# Patient Record
Sex: Female | Born: 1970 | Race: White | Hispanic: No | Marital: Married | Smoking: Never smoker
Health system: Southern US, Community
[De-identification: ages and names within clinical notes are randomized; demographics above are authoritative.]

## PROBLEM LIST (undated history)

## (undated) DIAGNOSIS — C801 Malignant (primary) neoplasm, unspecified: Secondary | ICD-10-CM

## (undated) DIAGNOSIS — M1990 Unspecified osteoarthritis, unspecified site: Secondary | ICD-10-CM

## (undated) DIAGNOSIS — M359 Systemic involvement of connective tissue, unspecified: Secondary | ICD-10-CM

## (undated) DIAGNOSIS — J189 Pneumonia, unspecified organism: Secondary | ICD-10-CM

## (undated) DIAGNOSIS — E559 Vitamin D deficiency, unspecified: Secondary | ICD-10-CM

## (undated) DIAGNOSIS — K219 Gastro-esophageal reflux disease without esophagitis: Secondary | ICD-10-CM

## (undated) DIAGNOSIS — C55 Malignant neoplasm of uterus, part unspecified: Secondary | ICD-10-CM

## (undated) DIAGNOSIS — D649 Anemia, unspecified: Secondary | ICD-10-CM

## (undated) DIAGNOSIS — E039 Hypothyroidism, unspecified: Secondary | ICD-10-CM

## (undated) DIAGNOSIS — E079 Disorder of thyroid, unspecified: Secondary | ICD-10-CM

## (undated) HISTORY — PX: ABDOMINAL HYSTERECTOMY: SHX81

## (undated) HISTORY — PX: NASAL SEPTUM SURGERY: SHX37

## (undated) HISTORY — PX: TONSILLECTOMY: SUR1361

## (undated) HISTORY — PX: OOPHORECTOMY: SHX86

---

## 2007-02-18 ENCOUNTER — Ambulatory Visit: Payer: Self-pay

## 2007-03-19 ENCOUNTER — Ambulatory Visit: Payer: Self-pay | Admitting: Gastroenterology

## 2010-11-09 ENCOUNTER — Ambulatory Visit: Payer: Self-pay | Admitting: Family Medicine

## 2013-11-06 ENCOUNTER — Ambulatory Visit: Payer: Self-pay | Admitting: Nurse Practitioner

## 2013-11-13 ENCOUNTER — Ambulatory Visit: Payer: Self-pay | Admitting: Nurse Practitioner

## 2015-11-18 ENCOUNTER — Other Ambulatory Visit: Payer: Self-pay | Admitting: Nurse Practitioner

## 2015-11-18 DIAGNOSIS — Z1231 Encounter for screening mammogram for malignant neoplasm of breast: Secondary | ICD-10-CM

## 2017-07-31 ENCOUNTER — Other Ambulatory Visit: Payer: Self-pay

## 2017-07-31 ENCOUNTER — Encounter: Payer: Self-pay | Admitting: Emergency Medicine

## 2017-07-31 ENCOUNTER — Ambulatory Visit
Admission: EM | Admit: 2017-07-31 | Discharge: 2017-07-31 | Disposition: A | Discharge status: Home / Self Care | Payer: BLUE CROSS/BLUE SHIELD | Attending: Family Medicine | Admitting: Family Medicine

## 2017-07-31 DIAGNOSIS — M2550 Pain in unspecified joint: Secondary | ICD-10-CM

## 2017-07-31 DIAGNOSIS — M25542 Pain in joints of left hand: Secondary | ICD-10-CM

## 2017-07-31 DIAGNOSIS — M25541 Pain in joints of right hand: Secondary | ICD-10-CM | POA: Diagnosis not present

## 2017-07-31 HISTORY — DX: Disorder of thyroid, unspecified: E07.9

## 2017-07-31 HISTORY — DX: Vitamin D deficiency, unspecified: E55.9

## 2017-07-31 MED ORDER — TRAMADOL HCL 50 MG PO TABS: 50.0000 mg | ORAL_TABLET | Freq: Three times a day (TID) | ORAL | 0 refills | Status: AC | PRN

## 2017-07-31 MED ORDER — MELOXICAM 15 MG PO TABS: 15.0000 mg | ORAL_TABLET | Freq: Every day | ORAL | 0 refills | Status: DC | PRN

## 2017-07-31 NOTE — ED Provider Notes (Signed)
[facility]   CSN: [id number] Arrival date & time: 07/31/17  0813  History   Chief Complaint Chief Complaint  Patient presents with  . Joint Pain  . Joint Swelling   HPI   47 year old female presents with joint pain and swelling.  Patient states that this is been going on since November or December.  Has been worse over the past 2 weeks.  She states that she has moderate to severe joint pain and swelling, particularly of the hands.  She states that it also affects the wrists, shoulders, knees, ankles, toes.  The hand pain and swelling is her predominant complaint.  No reports of joint redness or warmth.  Worse in the morning.  She is used ibuprofen and Aleve with some improvement initially but no improvement as of late.  No other associated symptoms.  No other complaints.  Past Medical History:  Diagnosis Date  . Thyroid disease   . Vitamin D deficiency    Past Surgical History:  Procedure Laterality Date  . NASAL SEPTUM SURGERY    . TONSILLECTOMY     OB History   None    Home Medications    Prior to Admission medications   Medication Sig Start Date End Date Taking? Authorizing Provider  Cholecalciferol (VITAMIN D) 2000 units CAPS Take 0.5 capsules by mouth 2 (two) times daily.   Yes [provider]  thyroid (ARMOUR THYROID) 90 MG tablet Take 0.5 tablets by mouth 2 (two) times daily. 11/30/16  Yes [provider]  meloxicam (MOBIC) 15 MG tablet Take 1 tablet (15 mg total) by mouth daily as needed. 07/31/17   Coral Spikes, DO  traMADol (ULTRAM) 50 MG tablet Take 1 tablet (50 mg total) by mouth every 8 (eight) hours as needed for up to 5 days. 07/31/17 08/05/17  Coral Spikes, DO    Family History Family History  Problem Relation Age of Onset  . Non-Hodgkin's lymphoma Mother   . Heart disease Father   . CAD Father 63       CABG x 3 vessels    Social History Social History   Tobacco Use  . Smoking status: Never Smoker  . Smokeless  tobacco: Never Used  Substance Use Topics  . Alcohol use: Yes    Alcohol/week: 1.2 oz    Types: 2 Glasses of wine per week  . Drug use: Never     Allergies   Patient has no known allergies.   Review of Systems Review of Systems  Constitutional: Negative.   Musculoskeletal: Positive for arthralgias and joint swelling.   Physical Exam Triage Vital Signs ED Triage Vitals  Enc Vitals Group     BP 07/31/17 0843 116/78     Pulse Rate 07/31/17 0843 80     Resp 07/31/17 0843 16     Temp 07/31/17 0843 98 F (36.7 C)     Temp Source 07/31/17 0843 Oral     SpO2 07/31/17 0843 100 %     Weight 07/31/17 0843 160 lb (72.6 kg)     Height 07/31/17 0843 5\' 6"  (1.676 m)     Head Circumference --      Peak Flow --      Pain Score 07/31/17 0842 5     Pain Loc --      Pain Edu? --      Excl. in [place]? --   Updated Vital Signs BP 116/78 (BP Location: Left Arm)   Pulse 80   Temp  59 F (36.7 C) (Oral)   Resp 16   Ht 5\' 6"  (1.676 m)   Wt 160 lb (72.6 kg)   LMP 07/19/2017 (Exact Date)   SpO2 100%   BMI 25.82 kg/m   Physical Exam  Constitutional: She is oriented to person, place, and time. She appears well-developed. No distress.  Cardiovascular: Normal rate and regular rhythm.  Pulmonary/Chest: Effort normal and breath sounds normal. She has no wheezes. She has no rales.  Musculoskeletal:  Hands -swelling noted of the PIP joints predominantly.  Nontender to palpation but tender with range of motion.  Normal range of motion of the wrist.  No discrete areas of tenderness.  Neurological: She is alert and oriented to person, place, and time.  Psychiatric: She has a normal mood and affect. Her behavior is normal.  Nursing note and vitals reviewed.    UC Treatments / Results  Labs (all labs ordered are listed, but only abnormal results are displayed) Labs Reviewed - No data to display  EKG None Radiology No results found.  Procedures Procedures (including critical care  time)  Medications Ordered in UC Medications - No data to display   Initial Impression / Assessment and Plan / UC Course  I have reviewed the triage vital signs and the nursing notes.  Pertinent labs & imaging results that were available during my care of the patient were reviewed by me and considered in my medical decision making (see chart for details).    47 year old female presents with joint pain and swelling, particularly of the fingers/hands.  Possible underlying rheumatologic disease.  Advised to follow-up with her primary and discuss rheumatology referral.  Treating with meloxicam and tramadol.  Final Clinical Impressions(s) / UC Diagnoses   Final diagnoses:  Pain in joint, multiple sites    ED Discharge Orders        Ordered    meloxicam (MOBIC) 15 MG tablet  Daily PRN     07/31/17 0914    traMADol (ULTRAM) 50 MG tablet  Every 8 hours PRN     07/31/17 0914     Controlled Substance Prescriptions [place] Controlled Substance Registry consulted? Yes. No patient was found in the database.   Coral Spikes, DO 07/31/17 1013

## 2017-07-31 NOTE — Discharge Instructions (Signed)
Medications as prescribed.  Call your primary for an appt.  Take care  Dr. Lacinda Axon

## 2017-07-31 NOTE — ED Triage Notes (Signed)
Patient in today c/o bilateral multiple joint pain and swelling x 4 months worsening over the last week.

## 2017-12-26 ENCOUNTER — Other Ambulatory Visit: Payer: Self-pay | Admitting: Nurse Practitioner

## 2017-12-26 DIAGNOSIS — Z1231 Encounter for screening mammogram for malignant neoplasm of breast: Secondary | ICD-10-CM

## 2018-03-28 ENCOUNTER — Encounter
Admission: RE | Admit: 2018-03-28 | Discharge: 2018-03-28 | Disposition: A | Discharge status: Home / Self Care | Payer: BLUE CROSS/BLUE SHIELD | Source: Ambulatory Visit | Attending: Obstetrics & Gynecology | Admitting: Obstetrics & Gynecology

## 2018-03-28 ENCOUNTER — Other Ambulatory Visit: Payer: Self-pay

## 2018-03-28 DIAGNOSIS — Z01812 Encounter for preprocedural laboratory examination: Secondary | ICD-10-CM | POA: Diagnosis present

## 2018-03-28 HISTORY — DX: Pneumonia, unspecified organism: J18.9

## 2018-03-28 HISTORY — DX: Unspecified osteoarthritis, unspecified site: M19.90

## 2018-03-28 HISTORY — DX: Anemia, unspecified: D64.9

## 2018-03-28 HISTORY — DX: Hypothyroidism, unspecified: E03.9

## 2018-03-28 LAB — TYPE AND SCREEN
ABO/RH(D): A POS
ANTIBODY SCREEN: NEGATIVE

## 2018-03-28 LAB — BASIC METABOLIC PANEL
Anion gap: 6 (ref 5–15)
BUN: 11 mg/dL (ref 6–20)
CHLORIDE: 109 mmol/L (ref 98–111)
CO2: 24 mmol/L (ref 22–32)
Calcium: 8.8 mg/dL — ABNORMAL LOW (ref 8.9–10.3)
Creatinine, Ser: 0.63 mg/dL (ref 0.44–1.00)
GFR calc Af Amer: >60 mL/min (ref >60)
GFR calc non Af Amer: >60 mL/min (ref >60)
Glucose, Bld: 86 mg/dL (ref 70–99)
Potassium: 3.9 mmol/L (ref 3.5–5.1)
Sodium: 139 mmol/L (ref 135–145)

## 2018-03-28 LAB — CBC
HCT: 33.3 % — ABNORMAL LOW (ref 36.0–46.0)
HEMOGLOBIN: 10.4 g/dL — AB (ref 12.0–15.0)
MCH: 29.1 pg (ref 26.0–34.0)
MCHC: 31.2 g/dL (ref 30.0–36.0)
MCV: 93 fL (ref 80.0–100.0)
Platelets: 354 10*3/uL (ref 150–400)
RBC: 3.58 MIL/uL — ABNORMAL LOW (ref 3.87–5.11)
RDW: 15 % (ref 11.5–15.5)
WBC: 5.1 10*3/uL (ref 4.0–10.5)
nRBC: 0 % (ref 0.0–0.2)

## 2018-03-28 NOTE — Patient Instructions (Signed)
Your procedure is scheduled on: April 05, 2018 Friday  Report to Day Surgery on the 2nd floor of the [facility]. To find out your arrival time, please call [phone] between 1PM - 3PM on: April 04, 2018 THURSDAY  REMEMBER: Instructions that are not followed completely may result in serious medical risk, up to and including death; or upon the discretion of your surgeon and anesthesiologist your surgery may need to be rescheduled.  Do not eat food after midnight the night before surgery.  No gum chewing, lozengers or hard candies.  You may however, drink CLEAR liquids up to 2 hours before you are scheduled to arrive for your surgery. Do not drink anything within 2 hours of the start of your surgery.  Clear liquids include: - water  - apple juice without pulp -CLEAR  gatorade - black coffee or tea (Do NOT add milk or creamers to the coffee or tea) Do NOT drink anything that is not on this list.  Type 1 and Type 2 diabetics should only drink water.  No Alcohol for 24 hours before or after surgery.  No Smoking including e-cigarettes for 24 hours prior to surgery.  No chewable tobacco products for at least 6 hours prior to surgery.  No nicotine patches on the day of surgery.  On the morning of surgery brush your teeth with toothpaste and water, you may rinse your mouth with mouthwash if you wish. Do not swallow any toothpaste or mouthwash.  Notify your doctor if there is any change in your medical condition (cold, fever, infection).  Do not wear jewelry, make-up, hairpins, clips or nail polish.  Do not wear lotions, powders, or perfumes.   Do not shave 48 hours prior to surgery.   Contacts and dentures may not be worn into surgery.  Do not bring valuables to the hospital, including drivers license, insurance or credit cards.  [facility] is not responsible for any belongings or valuables.   TAKE THESE MEDICATIONS THE MORNING OF SURGERY: ARMOUR THYROID  Use CHG  Soap  as directed on instruction sheet.  Stop Anti-inflammatories (NSAIDS) such as Advil, Aleve, Ibuprofen, Motrin, Naproxen, Naprosyn and Aspirin based products such as Excedrin, Goodys Powder, BC Powder. (May take Tylenol or Acetaminophen if needed.)  Stop ANY OVER THE COUNTER supplements until after surgery. FOLIC ACID  (May continue Vitamin D, Vitamin B, and multivitamin, FERROUS SULFATE .)  Wear comfortable clothing (specific to your surgery type) to the hospital.  Plan for stool softeners for home use.  If you are being discharged the day of surgery, you will not be allowed to drive home. You will need a responsible adult to drive you home and stay with you that night.   If you are taking public transportation, you will need to have a responsible adult with you. Please confirm with your physician that it is acceptable to use public transportation.   Please call [phone] if you have any questions about these instructions.

## 2018-04-04 MED ORDER — CEFAZOLIN SODIUM-DEXTROSE 2-4 GM/100ML-% IV SOLN
2.0000 g | Freq: Once | INTRAVENOUS | Status: AC
  Administered 2018-04-05: 2 g via INTRAVENOUS

## 2018-04-05 ENCOUNTER — Other Ambulatory Visit: Payer: Self-pay

## 2018-04-05 ENCOUNTER — Ambulatory Visit
Admission: RE | Admit: 2018-04-05 | Discharge: 2018-04-05 | Disposition: A | Discharge status: Home / Self Care | Payer: BLUE CROSS/BLUE SHIELD | Attending: Obstetrics & Gynecology | Admitting: Obstetrics & Gynecology

## 2018-04-05 ENCOUNTER — Encounter
Admission: RE | Disposition: A | Discharge status: Home / Self Care | Payer: Self-pay | Source: Home / Self Care | Attending: Obstetrics & Gynecology

## 2018-04-05 ENCOUNTER — Ambulatory Visit: Payer: BLUE CROSS/BLUE SHIELD | Admitting: Certified Registered Nurse Anesthetist

## 2018-04-05 ENCOUNTER — Encounter: Payer: Self-pay | Admitting: *Deleted

## 2018-04-05 DIAGNOSIS — M1990 Unspecified osteoarthritis, unspecified site: Secondary | ICD-10-CM | POA: Diagnosis not present

## 2018-04-05 DIAGNOSIS — C541 Malignant neoplasm of endometrium: Secondary | ICD-10-CM | POA: Insufficient documentation

## 2018-04-05 DIAGNOSIS — E039 Hypothyroidism, unspecified: Secondary | ICD-10-CM | POA: Diagnosis not present

## 2018-04-05 DIAGNOSIS — D649 Anemia, unspecified: Secondary | ICD-10-CM | POA: Insufficient documentation

## 2018-04-05 DIAGNOSIS — E559 Vitamin D deficiency, unspecified: Secondary | ICD-10-CM | POA: Insufficient documentation

## 2018-04-05 DIAGNOSIS — N920 Excessive and frequent menstruation with regular cycle: Secondary | ICD-10-CM | POA: Insufficient documentation

## 2018-04-05 DIAGNOSIS — Z79899 Other long term (current) drug therapy: Secondary | ICD-10-CM | POA: Diagnosis not present

## 2018-04-05 DIAGNOSIS — D259 Leiomyoma of uterus, unspecified: Secondary | ICD-10-CM

## 2018-04-05 DIAGNOSIS — Z807 Family history of other malignant neoplasms of lymphoid, hematopoietic and related tissues: Secondary | ICD-10-CM | POA: Diagnosis not present

## 2018-04-05 DIAGNOSIS — Z8249 Family history of ischemic heart disease and other diseases of the circulatory system: Secondary | ICD-10-CM | POA: Diagnosis not present

## 2018-04-05 HISTORY — PX: LAPAROSCOPIC BILATERAL SALPINGECTOMY: SHX5889

## 2018-04-05 HISTORY — PX: LAPAROSCOPIC SUPRACERVICAL HYSTERECTOMY: SHX5399

## 2018-04-05 LAB — POCT PREGNANCY, URINE: Preg Test, Ur: NEGATIVE

## 2018-04-05 LAB — ABO/RH: ABO/RH(D): A POS

## 2018-04-05 SURGERY — HYSTERECTOMY, SUPRACERVICAL, LAPAROSCOPIC
Anesthesia: General

## 2018-04-05 MED ORDER — CELECOXIB 200 MG PO CAPS
400.0000 mg | ORAL_CAPSULE | Freq: Once | ORAL | Status: AC
  Administered 2018-04-05: 400 mg via ORAL

## 2018-04-05 MED ORDER — GABAPENTIN 300 MG PO CAPS
600.0000 mg | ORAL_CAPSULE | Freq: Once | ORAL | Status: AC
  Administered 2018-04-05: 600 mg via ORAL

## 2018-04-05 MED ORDER — FENTANYL CITRATE (PF) 100 MCG/2ML IJ SOLN
INTRAMUSCULAR | Status: AC
  Administered 2018-04-05: 25 ug via INTRAVENOUS
  Filled 2018-04-05: qty 2

## 2018-04-05 MED ORDER — GABAPENTIN 300 MG PO CAPS
ORAL_CAPSULE | ORAL | Status: AC
  Filled 2018-04-05: qty 2

## 2018-04-05 MED ORDER — ACETAMINOPHEN 500 MG PO TABS
1000.0000 mg | ORAL_TABLET | Freq: Once | ORAL | Status: AC
  Administered 2018-04-05: 1000 mg via ORAL

## 2018-04-05 MED ORDER — HEPARIN SODIUM (PORCINE) 5000 UNIT/ML IJ SOLN
INTRAMUSCULAR | Status: AC
  Filled 2018-04-05: qty 1

## 2018-04-05 MED ORDER — LIDOCAINE HCL (CARDIAC) PF 100 MG/5ML IV SOSY
PREFILLED_SYRINGE | INTRAVENOUS | Status: DC | PRN
  Administered 2018-04-05: 100 mg via INTRAVENOUS

## 2018-04-05 MED ORDER — MORPHINE SULFATE (PF) 4 MG/ML IV SOLN: 1.0000 mg | INTRAVENOUS | Status: DC | PRN

## 2018-04-05 MED ORDER — ROCURONIUM BROMIDE 50 MG/5ML IV SOLN
INTRAVENOUS | Status: AC
  Filled 2018-04-05: qty 1

## 2018-04-05 MED ORDER — HEPARIN SODIUM (PORCINE) 5000 UNIT/ML IJ SOLN
5000.0000 [IU] | Freq: Once | INTRAMUSCULAR | Status: AC
  Administered 2018-04-05: 5000 [IU] via SUBCUTANEOUS

## 2018-04-05 MED ORDER — PROPOFOL 10 MG/ML IV BOLUS
INTRAVENOUS | Status: AC
  Filled 2018-04-05: qty 60

## 2018-04-05 MED ORDER — BUPIVACAINE LIPOSOME 1.3 % IJ SUSP
INTRAMUSCULAR | Status: AC
  Filled 2018-04-05: qty 20

## 2018-04-05 MED ORDER — LIDOCAINE HCL (PF) 2 % IJ SOLN
INTRAMUSCULAR | Status: AC
  Filled 2018-04-05: qty 10

## 2018-04-05 MED ORDER — FENTANYL CITRATE (PF) 100 MCG/2ML IJ SOLN
25.0000 ug | INTRAMUSCULAR | Status: DC | PRN
  Administered 2018-04-05 (×2): 25 ug via INTRAVENOUS

## 2018-04-05 MED ORDER — OXYCODONE HCL 5 MG PO TABS
ORAL_TABLET | ORAL | Status: AC
  Administered 2018-04-05: 5 mg via ORAL
  Filled 2018-04-05: qty 1

## 2018-04-05 MED ORDER — LACTATED RINGERS IV SOLN: INTRAVENOUS | Status: DC

## 2018-04-05 MED ORDER — OXYCODONE HCL 5 MG PO TABS
5.0000 mg | ORAL_TABLET | Freq: Once | ORAL | Status: AC
  Administered 2018-04-05: 5 mg via ORAL

## 2018-04-05 MED ORDER — ONDANSETRON HCL 4 MG/2ML IJ SOLN
INTRAMUSCULAR | Status: DC | PRN
  Administered 2018-04-05: 4 mg via INTRAVENOUS

## 2018-04-05 MED ORDER — MIDAZOLAM HCL 2 MG/2ML IJ SOLN
INTRAMUSCULAR | Status: DC | PRN
  Administered 2018-04-05: 2 mg via INTRAVENOUS

## 2018-04-05 MED ORDER — LACTATED RINGERS IV SOLN
INTRAVENOUS | Status: DC
  Administered 2018-04-05 (×2): via INTRAVENOUS

## 2018-04-05 MED ORDER — BUPIVACAINE HCL (PF) 0.5 % IJ SOLN
INTRAMUSCULAR | Status: AC
  Filled 2018-04-05: qty 30

## 2018-04-05 MED ORDER — BUPIVACAINE LIPOSOME 1.3 % IJ SUSP
INTRAMUSCULAR | Status: DC | PRN
  Administered 2018-04-05: 20 mL

## 2018-04-05 MED ORDER — CELECOXIB 200 MG PO CAPS
ORAL_CAPSULE | ORAL | Status: AC
  Filled 2018-04-05: qty 2

## 2018-04-05 MED ORDER — SUGAMMADEX SODIUM 200 MG/2ML IV SOLN
INTRAVENOUS | Status: DC | PRN
  Administered 2018-04-05: 180 mg via INTRAVENOUS

## 2018-04-05 MED ORDER — FAMOTIDINE 20 MG PO TABS
20.0000 mg | ORAL_TABLET | Freq: Once | ORAL | Status: AC
  Administered 2018-04-05: 20 mg via ORAL

## 2018-04-05 MED ORDER — DEXAMETHASONE SODIUM PHOSPHATE 10 MG/ML IJ SOLN
INTRAMUSCULAR | Status: AC
  Filled 2018-04-05: qty 1

## 2018-04-05 MED ORDER — ROCURONIUM BROMIDE 100 MG/10ML IV SOLN
INTRAVENOUS | Status: DC | PRN
  Administered 2018-04-05: 50 mg via INTRAVENOUS
  Administered 2018-04-05: 20 mg via INTRAVENOUS
  Administered 2018-04-05: 10 mg via INTRAVENOUS

## 2018-04-05 MED ORDER — FENTANYL CITRATE (PF) 100 MCG/2ML IJ SOLN
INTRAMUSCULAR | Status: AC
  Filled 2018-04-05: qty 2

## 2018-04-05 MED ORDER — ACETAMINOPHEN 500 MG PO TABS
ORAL_TABLET | ORAL | Status: AC
  Filled 2018-04-05: qty 2

## 2018-04-05 MED ORDER — FENTANYL CITRATE (PF) 100 MCG/2ML IJ SOLN
INTRAMUSCULAR | Status: DC | PRN
  Administered 2018-04-05 (×2): 50 ug via INTRAVENOUS

## 2018-04-05 MED ORDER — DEXAMETHASONE SODIUM PHOSPHATE 10 MG/ML IJ SOLN
INTRAMUSCULAR | Status: DC | PRN
  Administered 2018-04-05: 8 mg via INTRAVENOUS

## 2018-04-05 MED ORDER — KETOROLAC TROMETHAMINE 30 MG/ML IJ SOLN
INTRAMUSCULAR | Status: DC | PRN
  Administered 2018-04-05: 30 mg via INTRAVENOUS

## 2018-04-05 MED ORDER — PROPOFOL 10 MG/ML IV BOLUS
INTRAVENOUS | Status: DC | PRN
  Administered 2018-04-05: 150 mg via INTRAVENOUS

## 2018-04-05 MED ORDER — FAMOTIDINE 20 MG PO TABS
ORAL_TABLET | ORAL | Status: AC
  Filled 2018-04-05: qty 1

## 2018-04-05 MED ORDER — IBUPROFEN 800 MG PO TABS: 800.0000 mg | ORAL_TABLET | Freq: Four times a day (QID) | ORAL | 0 refills | Status: DC

## 2018-04-05 MED ORDER — SUGAMMADEX SODIUM 200 MG/2ML IV SOLN
INTRAVENOUS | Status: AC
  Filled 2018-04-05: qty 2

## 2018-04-05 MED ORDER — PHENYLEPHRINE HCL 10 MG/ML IJ SOLN
INTRAMUSCULAR | Status: DC | PRN
  Administered 2018-04-05: 50 ug via INTRAVENOUS

## 2018-04-05 MED ORDER — MIDAZOLAM HCL 2 MG/2ML IJ SOLN
INTRAMUSCULAR | Status: AC
  Filled 2018-04-05: qty 2

## 2018-04-05 MED ORDER — ONDANSETRON HCL 4 MG/2ML IJ SOLN
4.0000 mg | Freq: Once | INTRAMUSCULAR | Status: AC | PRN
  Administered 2018-04-05: 4 mg via INTRAVENOUS

## 2018-04-05 MED ORDER — OXYCODONE HCL 5 MG PO TABS: 5.0000 mg | ORAL_TABLET | Freq: Three times a day (TID) | ORAL | 0 refills | Status: DC | PRN

## 2018-04-05 MED ORDER — PHENYLEPHRINE HCL 10 MG/ML IJ SOLN
INTRAMUSCULAR | Status: AC
  Filled 2018-04-05: qty 1

## 2018-04-05 MED ORDER — ONDANSETRON HCL 4 MG/2ML IJ SOLN
INTRAMUSCULAR | Status: AC
  Filled 2018-04-05: qty 2

## 2018-04-05 MED ORDER — CEFAZOLIN SODIUM-DEXTROSE 2-4 GM/100ML-% IV SOLN
INTRAVENOUS | Status: AC
  Filled 2018-04-05: qty 100

## 2018-04-05 SURGICAL SUPPLY — 59 items
BAG URINE DRAINAGE (UROLOGICAL SUPPLIES) ×4 IMPLANT
BASIN GRAD PLASTIC 32OZ STRL (MISCELLANEOUS) ×4 IMPLANT
BLADE SURG SZ11 CARB STEEL (BLADE) ×4 IMPLANT
CANISTER SUCT 1200ML W/VALVE (MISCELLANEOUS) ×4 IMPLANT
CATH FOLEY 2WAY  5CC 16FR (CATHETERS) ×2
CATH URTH 16FR FL 2W BLN LF (CATHETERS) ×2 IMPLANT
CHLORAPREP W/TINT 26ML (MISCELLANEOUS) ×4 IMPLANT
COVER WAND RF STERILE (DRAPES) ×4 IMPLANT
DEFOGGER SCOPE WARMER CLEARIFY (MISCELLANEOUS) ×4 IMPLANT
DERMABOND ADVANCED (GAUZE/BANDAGES/DRESSINGS) ×2
DERMABOND ADVANCED .7 DNX12 (GAUZE/BANDAGES/DRESSINGS) ×2 IMPLANT
DRAPE LEGGINS SURG 28X43 STRL (DRAPES) ×4 IMPLANT
DRAPE SHEET LG 3/4 BI-LAMINATE (DRAPES) ×4 IMPLANT
DRAPE UNDER BUTTOCK W/FLU (DRAPES) ×4 IMPLANT
DRSG TELFA 4X3 1S NADH ST (GAUZE/BANDAGES/DRESSINGS) ×4 IMPLANT
GLOVE PI ORTHOPRO 6.5 (GLOVE) ×2
GLOVE PI ORTHOPRO STRL 6.5 (GLOVE) ×2 IMPLANT
GLOVE SURG SYN 6.5 ES PF (GLOVE) ×8 IMPLANT
GOWN STRL REUS W/ TWL LRG LVL3 (GOWN DISPOSABLE) ×6 IMPLANT
GOWN STRL REUS W/ TWL XL LVL3 (GOWN DISPOSABLE) ×2 IMPLANT
GOWN STRL REUS W/TWL LRG LVL3 (GOWN DISPOSABLE) ×6
GOWN STRL REUS W/TWL XL LVL3 (GOWN DISPOSABLE) ×2
GRASPER SUT TROCAR 14GX15 (MISCELLANEOUS) ×4 IMPLANT
HANDLE YANKAUER SUCT BULB TIP (MISCELLANEOUS) ×4 IMPLANT
IRRIGATION STRYKERFLOW (MISCELLANEOUS) IMPLANT
IRRIGATOR STRYKERFLOW (MISCELLANEOUS)
IV LACTATED RINGERS 1000ML (IV SOLUTION) ×4 IMPLANT
KIT PINK PAD W/HEAD ARE REST (MISCELLANEOUS) ×4
KIT PINK PAD W/HEAD ARM REST (MISCELLANEOUS) ×2 IMPLANT
KIT TURNOVER CYSTO (KITS) ×4 IMPLANT
L-HOOK LAP DISP 36CM (ELECTROSURGICAL) ×4
LABEL OR SOLS (LABEL) ×4 IMPLANT
LHOOK LAP DISP 36CM (ELECTROSURGICAL) ×2 IMPLANT
LIGASURE VESSEL 5MM BLUNT TIP (ELECTROSURGICAL) ×4 IMPLANT
MANIPULATOR VCARE MED CRV RETR (MISCELLANEOUS) ×4 IMPLANT
MORCELLATOR XCISE  COR (MISCELLANEOUS) ×2
MORCELLATOR XCISE COR (MISCELLANEOUS) ×2 IMPLANT
NEEDLE HYPO 22GX1.5 SAFETY (NEEDLE) ×4 IMPLANT
NS IRRIG 500ML POUR BTL (IV SOLUTION) ×4 IMPLANT
OCCLUDER COLPOPNEUMO (BALLOONS) ×4 IMPLANT
PACK LAP CHOLECYSTECTOMY (MISCELLANEOUS) ×4 IMPLANT
PAD OB MATERNITY 4.3X12.25 (PERSONAL CARE ITEMS) ×4 IMPLANT
PAD PREP 24X41 OB/GYN DISP (PERSONAL CARE ITEMS) ×4 IMPLANT
PENCIL ELECTRO HAND CTR (MISCELLANEOUS) ×4 IMPLANT
POUCH ENDO CATCH II 15MM (MISCELLANEOUS) ×4 IMPLANT
RETRACTOR RING XSMALL (MISCELLANEOUS) ×2 IMPLANT
RTRCTR WOUND ALEXIS 13CM XS SH (MISCELLANEOUS) ×4
SLEEVE ENDOPATH XCEL 5M (ENDOMECHANICALS) ×8 IMPLANT
SUT MNCRL 4-0 (SUTURE) ×2
SUT MNCRL 4-0 27XMFL (SUTURE) ×2
SUT MNCRL AB 4-0 PS2 18 (SUTURE) IMPLANT
SUT VIC AB 0 CT1 36 (SUTURE) ×4 IMPLANT
SUT VICRYL 0 UR6 27IN ABS (SUTURE) ×4 IMPLANT
SUTURE MNCRL 4-0 27XMF (SUTURE) ×2 IMPLANT
SYR 30ML LL (SYRINGE) ×4 IMPLANT
TROCAR ENDO BLADELESS 11MM (ENDOMECHANICALS) ×4 IMPLANT
TROCAR XCEL NON-BLD 5MMX100MML (ENDOMECHANICALS) ×4 IMPLANT
TUBING INSUF HEATED (TUBING) ×4 IMPLANT
TUBING INSUFFLATION (TUBING) ×4 IMPLANT

## 2018-04-05 NOTE — Anesthesia Post-op Follow-up Note (Signed)
Anesthesia QCDR form completed.        

## 2018-04-05 NOTE — Anesthesia Procedure Notes (Signed)
Procedure Name: Intubation Date/Time: 04/05/2018 7:38 AM Performed by: Lowry Bowl, CRNA Pre-anesthesia Checklist: Patient identified, Emergency Drugs available, Suction available and Patient being monitored Patient Re-evaluated:Patient Re-evaluated prior to induction Oxygen Delivery Method: Circle system utilized Preoxygenation: Pre-oxygenation with 100% oxygen Induction Type: IV induction and Cricoid Pressure applied Ventilation: Mask ventilation without difficulty Laryngoscope Size: 3 and McGraph Grade View: Grade I Tube type: Oral Tube size: 7.0 mm Number of attempts: 2 (Grade 3 view w/ MAC 3 d/t anterior airway; elected to use McGraph) Airway Equipment and Method: Stylet Placement Confirmation: ETT inserted through vocal cords under direct vision,  positive ETCO2 and breath sounds checked- equal and bilateral Secured at: 21 cm Tube secured with: Tape Dental Injury: Teeth and Oropharynx as per pre-operative assessment

## 2018-04-05 NOTE — OR Nursing (Signed)
Discharge instructions discussed with patient and husband. Both voice understanding.

## 2018-04-05 NOTE — Op Note (Addendum)
Total Laparoscopic Hysterectomy Operative Note Procedure Date: 04/05/2018  Patient:  Karla Vance  47 y.o. female  PRE-OPERATIVE DIAGNOSIS:  fibroid uterus  POST-OPERATIVE DIAGNOSIS:  fibroid uterus  PROCEDURE:  Procedure(s): LAPAROSCOPIC SUPRACERVICAL HYSTERECTOMY, with power and hand morecellation LAPAROSCOPIC BILATERAL SALPINGECTOMY (Bilateral) MINI-LAPAROTOMY  SURGEON:  Surgeon(s) and Role:    * Jontavious Commons, Honor Loh, MD - Primary    * Benjaman Kindler, MD - Assisting  ANESTHESIA:  General via ET  I/O  Total I/O In: 1200 [I.V.:1200] Out: 500 [Urine:300; Blood:200]  FINDINGS:   Large, heavy pelvis-filling uterus, normal ovaries and fallopian tubes bilaterally.   Normal upper abdomen. During morcellation, a fleshy cluster of polyps was uncovered.  Unsure if this was endometrial or within a fibroid.    SPECIMEN:  1. Uterus with bilateral fallopian tubes 2. Abnormal uterine tissue  COMPLICATIONS: none apparent  DISPOSITION: vital signs stable to PACU  Indication for Surgery: 47 y.o. with worsening menorrhagia and large fibroid uterus presented for definitive surgery to remove uterus.    Risks of surgery were discussed with the patient including but not limited to: bleeding which may require transfusion or reoperation; infection which may require antibiotics; injury to bowel, bladder, ureters or other surrounding organs; need for additional procedures including laparotomy, blood clot, incisional problems, seeding of malignancy within the abdominal cavity, and other postoperative/anesthesia complications. Written informed consent was obtained.    PROCEDURE IN DETAIL:  The patient had 5000u Heparin Sub-q and sequential compression devices applied to her lower extremities while in the preoperative area.  She was then taken to the operating room. IV antibiotics were given. General anesthesia was administered via endotracheal route.  She was placed in the dorsal lithotomy position,  and was prepped and draped in a sterile manner. A surgical time-out was performed.  A Foley catheter was inserted into her bladder and attached to constant drainage and a V-Care uterine manipulator was then advanced into the uterus and a good fit around the cervix was noted. The gloves were changed, and attention was turned to the abdomen where an umbilical incision was made with the scalpel.  A 59mm trochar was inserted in the umbilical incision using a visiport method.Opening pressure was 84mmHg, and the abdomen was insufflated to 36mmHg carbon dioxide gas and adequate pneumoperitoneum was obtained. A survey of the patient's pelvis and abdomen revealed the findings as mentioned above. A 55mm port was placed in the RLQ and a 76mm port in the LLQ under visualization.    The bilateral fallopian tubes were separated from the mesosalpinx using the Ligasure. The bilateral round ligaments were transected and anterior broad ligament divided and brought across the uterus to separate the vesicouterine peritoneum and create a bladder flap. The bladder was pushed away from the uterus. The bilateral uterine arteries were skeletonized, ligated and transected. The uterus was transected from the cervix using cautery.  The V-care was removed.  The 44mm port was removed and the power morcellator was inserted, with the safety cap on.  The power morcellator was turned on and a few attempts to get a good purchase of the uterus were made.  The tissue was difficult to morcellate.  With one pass, the underlying exposed tissue contained several fleshy polyps, and the decision was made immediately to abort the power morcellation and convert to hand-morcellation with the uterus within a bag.  The 17cm bag was inserted into the 73mm port and the uterus placed within.  A mini-laparotomy was made suprapubically and an Chiropodist  placed inside of the bag.  Using an 11-blade, the uterus was hand-morcellated - it remained a difficult  morcellation due to the weight and toughness of the tissue.  Once the uterus was removed, the bag and retractor were removed and the fascia closed with 0-vicryl.  After a change of gloves, the pneumoperitoneum was recreated and surgical site inspected, and found to be hemostatic. Bilateral ureters were visualized vermiuclating. No intraoperative injury to surrounding organs was noted, and no fragments of uterine tissue were left in the abdomen. The 11-port site was closed with the inlet closure device.  The abdomen was desufflated and all instruments were then removed.   All skin incisions were closed with 4-0 monocryl and covered with surgical glue. The patient tolerated the procedures well.  All instruments, needles, and sponge counts were correct x 2. The patient was taken to the recovery room in stable condition.   Due to the difficulty of this case, an additional 1.5 hours was spent outside of the expected time for this surgery.  ---- Larey Days, MD Attending Obstetrician and Gynecologist [facility]

## 2018-04-05 NOTE — OR Nursing (Signed)
Zofran given for nausea - approx 60cc green emesis.  Dr. Leonides Schanz just in to se pt.

## 2018-04-05 NOTE — H&P (Addendum)
Preoperative History and Physical  Karla Vance is a 47 y.o.  here for surgical management of symptomatic large fibroid uterus.   No significant preoperative concerns.  Proposed surgery: Supracervical hysterectomy with power morcellation, vs total laparoscopic hysterectomy with hand morcellation through the vagina.  With Bilateral salpingectomy.  Past Medical History:  Diagnosis Date  . Anemia   . Arthritis   . Hypothyroidism   . Pneumonia    as a child  . Thyroid disease   . Vitamin D deficiency    Past Surgical History:  Procedure Laterality Date  . NASAL SEPTUM SURGERY    . TONSILLECTOMY     OB History  No obstetric history on file.  Patient denies any other pertinent gynecologic issues.   No current facility-administered medications on file prior to encounter.    Current Outpatient Medications on File Prior to Encounter  Medication Sig Dispense Refill  . Cholecalciferol (VITAMIN D3) 25 MCG (1000 UT) CAPS Take 1,000 Units by mouth 2 (two) times daily.    . ferrous sulfate 325 (65 FE) MG tablet Take 325 mg by mouth daily with breakfast.    . folic acid (FOLVITE) 1 MG tablet Take 1 mg by mouth daily.    . medroxyPROGESTERone (PROVERA) 10 MG tablet Take 20 mg by mouth 2 (two) times daily.    . methotrexate (RHEUMATREX) 2.5 MG tablet Take 20 mg by mouth every Wednesday. Caution:Chemotherapy. Protect from light.    . thyroid (ARMOUR THYROID) 90 MG tablet Take 90 mg by mouth daily.      No Known Allergies  Social History:   reports that she has never smoked. She has never used smokeless tobacco. She reports current alcohol use of about 2.0 standard drinks of alcohol per week. She reports that she does not use drugs.  Family History  Problem Relation Age of Onset  . Non-Hodgkin's lymphoma Mother   . Heart disease Father   . CAD Father 55       CABG x 3 vessels    Review of Systems: Noncontributory  PHYSICAL EXAM: Blood pressure 120/78, pulse 80, temperature (!) 97.5  F (36.4 C), temperature source Oral, resp. rate 18, height 5\' 5"  (1.651 m), weight 76.5 kg, last menstrual period 03/23/2018, SpO2 100 %. General appearance - alert, well appearing, and in no distress Chest - clear to auscultation, no wheezes, rales or rhonchi, symmetric air entry Heart - normal rate and regular rhythm Abdomen - soft, nontender, nondistended, no masses or organomegaly Pelvic - examination not indicated Extremities - peripheral pulses normal, no pedal edema, no clubbing or cyanosis  Labs: Results for orders placed or performed during the hospital encounter of 04/05/18 (from the past 336 hour(s))  Pregnancy, urine POC   Collection Time: 04/05/18  6:31 AM  Result Value Ref Range   Preg Test, Ur NEGATIVE NEGATIVE  Results for orders placed or performed during the hospital encounter of 03/28/18 (from the past 336 hour(s))  CBC   Collection Time: 03/28/18 10:30 AM  Result Value Ref Range   WBC 5.1 4.0 - 10.5 K/uL   RBC 3.58 (L) 3.87 - 5.11 MIL/uL   Hemoglobin 10.4 (L) 12.0 - 15.0 g/dL   HCT 33.3 (L) 36.0 - 46.0 %   MCV 93.0 80.0 - 100.0 fL   MCH 29.1 26.0 - 34.0 pg   MCHC 31.2 30.0 - 36.0 g/dL   RDW 15.0 11.5 - 15.5 %   Platelets 354 150 - 400 K/uL   nRBC 0.0 0.0 -  0.2 %  Basic metabolic panel   Collection Time: 03/28/18 10:30 AM  Result Value Ref Range   Sodium 139 135 - 145 mmol/L   Potassium 3.9 3.5 - 5.1 mmol/L   Chloride 109 98 - 111 mmol/L   CO2 24 22 - 32 mmol/L   Glucose, Bld 86 70 - 99 mg/dL   BUN 11 6 - 20 mg/dL   Creatinine, Ser 0.63 0.44 - 1.00 mg/dL   Calcium 8.8 (L) 8.9 - 10.3 mg/dL   GFR calc non Af Amer >60 >60 mL/min   GFR calc Af Amer >60 >60 mL/min   Anion gap 6 5 - 15  Type and screen [place]   Collection Time: 03/28/18 10:30 AM  Result Value Ref Range   ABO/RH(D) A POS    Antibody Screen NEG    Sample Expiration 04/11/2018    Extend sample reason      NO TRANSFUSIONS OR PREGNANCY IN THE PAST 3  MONTHS Performed at [facility], [address] [place], [place] [address]     Imaging Studies: No results found.  Assessment: Patient Active Problem List   Diagnosis Date Noted  . Fibroid uterus 04/05/2018  . Menorrhagia 04/05/2018    Plan: Patient will undergo surgical management with Laparoscopic Hysterectomy and Bilateral Salpingectomy.  We discussed route, and if the uterus is able to be morcellated through the vagina then we may do that rather than through power morcellation.  Will have to see intraoperatively.  The risks of surgery were discussed in detail with the patient including but not limited to: bleeding which may require transfusion or reoperation; infection which may require antibiotics; injury to surrounding organs which may involve bowel, bladder, ureters ; need for additional procedures including laparoscopy or laparotomy; thromboembolic phenomenon, surgical site problems and other postoperative/anesthesia complications. Likelihood of success in alleviating the patient's condition was discussed. Routine postoperative instructions will be reviewed with the patient and her family in detail after surgery.  The patient concurred with the proposed plan, giving informed written consent for the surgery.  Patient has been NPO since last night she will remain NPO for procedure.  Anesthesia and OR aware.  Preoperative prophylactic antibiotics and SCDs ordered on call to the OR.  To OR when ready.  ----- Larey Days, MD Attending Obstetrician and Gynecologist [facility], Department of [facility]

## 2018-04-05 NOTE — Discharge Instructions (Addendum)
Discharge instructions:  Call office if you have any of the following: fever >101 F, chills, shortness of breath, excessive vaginal bleeding, incision drainage or problems, leg pain or redness, or any other concerns.   Activity: Do not lift > 10 lbs for 8 weeks.   No driving for 1-2 weeks, until you are certain you can slam on the brakes.   You may feel some pain in your upper right abdomen/rib and right shoulder.  This is from the gas in the abdomen for surgery. This will subside over time, please be patient!  Take 800mg  Ibuprofen and 1000mg  Tylenol around the clock, together, every 6 hours for at least the first 3-5 days.  After this you can take as needed.  This will help decrease inflammation and promote healing.  The narcotics you'll take just as needed, as they just trick your brain into thinking its not in pain.    Please don't limit yourself in terms of routine activity.  You will be able to do most things, although they may take longer to do or be a little painful.  You can do it!  Don't be a hero, but don't be a wimp either!    AMBULATORY SURGERY  DISCHARGE INSTRUCTIONS   1) The drugs that you were given will stay in your system until tomorrow so for the next 24 hours you should not:  A) Drive an automobile B) Make any legal decisions C) Drink any alcoholic beverage   2) You may resume regular meals tomorrow.  Today it is better to start with liquids and gradually work up to solid foods.  You may eat anything you prefer, but it is better to start with liquids, then soup and crackers, and gradually work up to solid foods.   3) Please notify your doctor immediately if you have any unusual bleeding, trouble breathing, redness and pain at the surgery site, drainage, fever, or pain not relieved by medication.    4) Additional Instructions:        Please contact your physician with any problems or Same Day Surgery at [phone], Monday through Friday 6 am to 4 pm, or  [place] [facility] number at [phone].

## 2018-04-05 NOTE — Anesthesia Preprocedure Evaluation (Signed)
Anesthesia Evaluation  Patient identified by MRN, date of birth, ID band Patient awake    Reviewed: Allergy & Precautions, H&P , NPO status , Patient's Chart, lab work & pertinent test results, reviewed documented beta blocker date and time   Airway Mallampati: II  TM Distance: >3 FB Neck ROM: full    Dental  (+) Teeth Intact   Pulmonary neg pulmonary ROS, pneumonia,    Pulmonary exam normal        Cardiovascular Exercise Tolerance: Good negative cardio ROS Normal cardiovascular exam Rhythm:regular Rate:Normal     Neuro/Psych negative neurological ROS  negative psych ROS   GI/Hepatic negative GI ROS, Neg liver ROS,   Endo/Other  negative endocrine ROSHypothyroidism   Renal/GU negative Renal ROS  negative genitourinary   Musculoskeletal   Abdominal   Peds  Hematology negative hematology ROS (+) Blood dyscrasia, anemia ,   Anesthesia Other Findings Past Medical History: No date: Anemia No date: Arthritis No date: Hypothyroidism No date: Pneumonia     Comment:  as a child No date: Thyroid disease No date: Vitamin D deficiency Past Surgical History: No date: NASAL SEPTUM SURGERY No date: TONSILLECTOMY BMI    Body Mass Index:  28.07 kg/m     Reproductive/Obstetrics negative OB ROS                             Anesthesia Physical Anesthesia Plan  ASA: II  Anesthesia Plan: General ETT   Post-op Pain Management:    Induction:   PONV Risk Score and Plan:   Airway Management Planned:   Additional Equipment:   Intra-op Plan:   Post-operative Plan:   Informed Consent: I have reviewed the patients History and Physical, chart, labs and discussed the procedure including the risks, benefits and alternatives for the proposed anesthesia with the patient or authorized representative who has indicated his/her understanding and acceptance.   Dental Advisory Given  Plan Discussed  with: CRNA  Anesthesia Plan Comments:         Anesthesia Quick Evaluation

## 2018-04-05 NOTE — Transfer of Care (Signed)
Immediate Anesthesia Transfer of Care Note  Patient: Karla Vance  Procedure(s) Performed: LAPAROSCOPIC SUPRACERVICAL HYSTERECTOMY, with powe morcellation (N/A ) LAPAROSCOPIC BILATERAL SALPINGECTOMY (Bilateral )  Patient Location: PACU  Anesthesia Type:General  Level of Consciousness: awake, oriented, drowsy and patient cooperative  Airway & Oxygen Therapy: Patient Spontanous Breathing  Post-op Assessment: Report given to RN, Post -op Vital signs reviewed and stable and Patient moving all extremities  Post vital signs: Reviewed and stable  Last Vitals:  Vitals Value Taken Time  BP 103/63 04/05/2018 11:11 AM  Temp 36 C 04/05/2018 11:11 AM  Pulse 74 04/05/2018 11:15 AM  Resp 16 04/05/2018 11:15 AM  SpO2 96 % 04/05/2018 11:15 AM  Vitals shown include unvalidated device data.  Last Pain:  Vitals:   04/05/18 1111  TempSrc: Tympanic  PainSc:          Complications: No apparent anesthesia complications

## 2018-04-05 NOTE — OR Nursing (Signed)
Denies nausea, up to bathroom for void, back to chair, tolerated well.

## 2018-04-08 ENCOUNTER — Encounter: Payer: Self-pay | Admitting: Obstetrics & Gynecology

## 2018-04-12 ENCOUNTER — Other Ambulatory Visit: Payer: Self-pay | Admitting: Anatomic Pathology & Clinical Pathology

## 2018-04-12 LAB — SURGICAL PATHOLOGY

## 2018-04-12 NOTE — Anesthesia Postprocedure Evaluation (Signed)
Anesthesia Post Note  Patient: Karla Vance  Procedure(s) Performed: LAPAROSCOPIC SUPRACERVICAL HYSTERECTOMY, with powe morcellation (N/A ) LAPAROSCOPIC BILATERAL SALPINGECTOMY (Bilateral )  Patient location during evaluation: PACU Anesthesia Type: General Level of consciousness: awake and alert Pain management: pain level controlled Vital Signs Assessment: post-procedure vital signs reviewed and stable Respiratory status: spontaneous breathing, nonlabored ventilation, respiratory function stable and patient connected to nasal cannula oxygen Cardiovascular status: blood pressure returned to baseline and stable Postop Assessment: no apparent nausea or vomiting Anesthetic complications: no     Last Vitals:  Vitals:   04/05/18 1323 04/05/18 1447  BP: 103/71 104/70  Pulse:  70  Resp: 16 16  Temp:  36.4 C  SpO2:  100%    Last Pain:  Vitals:   04/05/18 1447  TempSrc: Temporal  PainSc: 0-No pain                 Molli Barrows

## 2018-04-23 ENCOUNTER — Other Ambulatory Visit: Payer: Self-pay | Admitting: Obstetrics & Gynecology

## 2018-04-23 DIAGNOSIS — C541 Malignant neoplasm of endometrium: Secondary | ICD-10-CM

## 2018-04-24 ENCOUNTER — Ambulatory Visit
Admission: RE | Admit: 2018-04-24 | Discharge: 2018-04-24 | Disposition: A | Discharge status: Home / Self Care | Payer: BLUE CROSS/BLUE SHIELD | Source: Ambulatory Visit | Attending: Obstetrics & Gynecology | Admitting: Obstetrics & Gynecology

## 2018-04-24 ENCOUNTER — Inpatient Hospital Stay: Payer: BLUE CROSS/BLUE SHIELD | Attending: Obstetrics and Gynecology | Admitting: Obstetrics and Gynecology

## 2018-04-24 DIAGNOSIS — Z90710 Acquired absence of both cervix and uterus: Secondary | ICD-10-CM

## 2018-04-24 DIAGNOSIS — C541 Malignant neoplasm of endometrium: Secondary | ICD-10-CM | POA: Diagnosis not present

## 2018-04-24 DIAGNOSIS — Z90722 Acquired absence of ovaries, bilateral: Secondary | ICD-10-CM | POA: Insufficient documentation

## 2018-04-24 HISTORY — DX: Systemic involvement of connective tissue, unspecified: M35.9

## 2018-04-24 HISTORY — DX: Malignant (primary) neoplasm, unspecified: C80.1

## 2018-04-24 MED ORDER — IOHEXOL 300 MG/ML  SOLN
100.0000 mL | Freq: Once | INTRAMUSCULAR | Status: AC | PRN
  Administered 2018-04-24: 100 mL via INTRAVENOUS

## 2018-04-24 NOTE — Progress Notes (Signed)
Gynecologic Oncology Consult Visit   Referring Provider: Dr. Vikki Ports Ward  Chief Complaint: Endometrial Stromal Sarcoma  Subjective:  Karla Vance is a 48 y.o. P6 female who is seen in consultation from Dr. Leonides Schanz for new diagnosis of endometrial stromal sarcoma.   She was seen by Dr. Leonides Schanz for surgical management of symptomatic large fibroid uterus. On 04/05/18 she underwent a laparoscopic supracervical hysterectomy with power and hand morecellation and laparoscopic bilateral salpingectomy.   Pathology:  A. UTERUS WITHOUT CERVIX, AND WITH BILATERAL FALLOPIAN TUBES;  SUPRACERVICAL HYSTERECTOMY WITH BILATERAL SALPINGECTOMY:  - LOW-GRADE ENDOMETRIAL STROMAL SARCOMA, MEASURING AT LEAST 3.0 CM IN LARGEST SAMPLED SECTION, PRESENT IN 17 OF 27 TISSUE BLOCKS.  - ANGIOLYMPHATIC INVASION PRESENT.  - MARGIN STATUS AND STAGING CANNOT BE RELIABLY EVALUATED DUE TO MORCELLATION OF SPECIMEN.  - BILATERAL FALLOPIAN TUBES WITH NO SIGNIFICANT HISTOPATHOLOGIC CHANGE.   B. ABNORMAL UTERINE TISSUE; CURETTAGE:  - LOW-GRADE ENDOMETRIAL STROMAL SARCOMA, PRESENT IN 5 OF 5 TISSUE BLOCKS.   04/24/2018- CT Abdomen Pelvis W Contrast 1. No definite findings to suggest metastatic disease in the abdomen or pelvis. Lymph nodes in the right pelvis are upper normal for size and close attention on follow-up recommended. 2. Small volume free fluid in the cul-de-sac, nonspecific.  She is consented for surgery next week for laparoscopic BSO, trachelectomy, sentinel node injection/mapping/sampling with Drs. Ward and [employer].   She presents today to discuss management options.   Problem List: Patient Active Problem List   Diagnosis Date Noted  . Endometrial stromal sarcoma ([place]) 04/24/2018  . Fibroid uterus 04/05/2018  . Menorrhagia 04/05/2018    Past Medical History: Past Medical History:  Diagnosis Date  . Anemia   . Arthritis   . Cancer [facility])    uterine cancer, sarcoma   . Collagen vascular disease ([facility])    RA   . Hypothyroidism   . Pneumonia    as a child  . Thyroid disease   . Vitamin D deficiency     Past Surgical History: Past Surgical History:  Procedure Laterality Date  . LAPAROSCOPIC BILATERAL SALPINGECTOMY Bilateral 04/05/2018   Procedure: LAPAROSCOPIC BILATERAL SALPINGECTOMY;  Surgeon: Ward, Honor Loh, MD;  Location: [facility] ORS;  Service: Gynecology;  Laterality: Bilateral;  . LAPAROSCOPIC SUPRACERVICAL HYSTERECTOMY N/A 04/05/2018   Procedure: LAPAROSCOPIC SUPRACERVICAL HYSTERECTOMY, with powe morcellation;  Surgeon: Ward, Honor Loh, MD;  Location: [facility] ORS;  Service: Gynecology;  Laterality: N/A;  . NASAL SEPTUM SURGERY    . TONSILLECTOMY      Past Gynecologic History:  No abnormal Paps Menarche 62 Sexually active  OB History:  OB History  Gravida Para Term Preterm AB Living  6 6          SAB TAB Ectopic Multiple Live Births               # Outcome Date GA Lbr Len/2nd Weight Sex Delivery Anes PTL Lv  6 Para           5 Para           4 Para           3 Para           2 Para           1 Para             Obstetric Comments  NSVD x 6    Family History: Family History  Problem Relation Age of Onset  . Non-Hodgkin's lymphoma Mother   . Heart disease  Father   . CAD Father 61       CABG x 3 vessels    Social History: Social History   Socioeconomic History  . Marital status: Married    Spouse name: Not on file  . Number of children: Not on file  . Years of education: Not on file  . Highest education level: Not on file  Occupational History  . Not on file  Social Needs  . Financial resource strain: Not on file  . Food insecurity:    Worry: Not on file    Inability: Not on file  . Transportation needs:    Medical: Not on file    Non-medical: Not on file  Tobacco Use  . Smoking status: Never Smoker  . Smokeless tobacco: Never Used  Substance and Sexual Activity  . Alcohol use: Yes    Alcohol/week: 2.0 standard drinks    Types: 2 Glasses of wine per  week  . Drug use: Never  . Sexual activity: Yes  Lifestyle  . Physical activity:    Days per week: Not on file    Minutes per session: Not on file  . Stress: Not on file  Relationships  . Social connections:    Talks on phone: Not on file    Gets together: Not on file    Attends religious service: Not on file    Active member of club or organization: Not on file    Attends meetings of clubs or organizations: Not on file    Relationship status: Not on file  . Intimate partner violence:    Fear of current or ex partner: Not on file    Emotionally abused: Not on file    Physically abused: Not on file    Forced sexual activity: Not on file  Other Topics Concern  . Not on file  Social History Narrative  . Not on file    Allergies: No Known Allergies  Current Medications: Current Outpatient Medications  Medication Sig Dispense Refill  . Cholecalciferol (VITAMIN D3) 25 MCG (1000 UT) CAPS Take 1,000 Units by mouth 2 (two) times daily.    . ferrous sulfate 325 (65 FE) MG tablet Take 325 mg by mouth daily with breakfast.    . folic acid (FOLVITE) 1 MG tablet Take 1 mg by mouth daily.    . methotrexate (RHEUMATREX) 2.5 MG tablet Take 20 mg by mouth every Wednesday. Caution:Chemotherapy. Protect from light.    . thyroid (ARMOUR THYROID) 90 MG tablet Take 90 mg by mouth daily.     Marland Kitchen ibuprofen (ADVIL,MOTRIN) 800 MG tablet Take 1 tablet (800 mg total) by mouth every 6 (six) hours. (Patient not taking: Reported on 04/24/2018) 45 tablet 0  . oxyCODONE (ROXICODONE) 5 MG immediate release tablet Take 1 tablet (5 mg total) by mouth every 8 (eight) hours as needed. (Patient not taking: Reported on 04/24/2018) 16 tablet 0   No current facility-administered medications for this visit.     Review of Systems General: negative for fevers, chills, fatigue, changes in sleep, changes in weight or appetite Skin: negative for changes in color, texture, moles or lesions Eyes: negative for changes in  vision, pain, diplopia HEENT: negative for change in hearing, pain, discharge, tinnitus, vertigo, voice changes, sore throat, neck masses Pulmonary: negative for dyspnea, orthopnea, productive cough Cardiac: negative for palpitations, syncope, pain, discomfort, pressure Gastrointestinal: negative for dysphagia, nausea, vomiting, jaundice, pain, constipation, diarrhea, hematemesis, hematochezia Genitourinary/Sexual: negative for dysuria, discharge, hesitancy, nocturia, retention, stones, infections,  STD's, incontinence Ob/Gyn: negative for irregular bleeding, pain Musculoskeletal: negative for pain, stiffness, swelling, range of motion limitation Hematology: negative for easy bruising, bleeding Neurologic/Psych: negative for headaches, seizures, paralysis, weakness, tremor, change in gait, change in sensation, mood swings, depression, anxiety, change in memory   Objective:  Physical Examination:  BP 120/81   Pulse 78   Temp 98.7 F (37.1 C) (Tympanic)   Resp 18   Ht 5\' 5"  (1.651 m)   Wt 169 lb (76.7 kg)   BMI 28.12 kg/m     ECOG Performance Status: 0 - Asymptomatic  GENERAL: Patient is a well appearing female in no acute distress HEENT:  PERRL, neck supple with midline trachea. Thyroid without masses.  NODES:  No cervical, supraclavicular, axillary, or inguinal lymphadenopathy palpated.  LUNGS:  Clear to auscultation bilaterally.  No wheezes or rhonchi. HEART:  Regular rate and rhythm. No murmur appreciated. BACK: Nontender ABDOMEN:  Soft, nontender.  No ascites or hepatomegaly. No distension. All incisions well healed and no evidence of erythema, skin separation, or draingage.  MSK:  No focal spinal tenderness to palpation. Full range of motion bilaterally in the upper extremities. EXTREMITIES:  No peripheral edema.   SKIN:  Clear with no obvious rashes or skin changes. No nail dyscrasia. NEURO:  Nonfocal. Well oriented.  Appropriate affect.  Pelvic: EGBUS: no lesions Cervix:  no lesions, multiparous, nontender, mobile Vagina: no lesions, no discharge or bleeding Uterus: Surgically absent  Adnexa: no palpable masses Rectovaginal: confirmatory  Lab Review Labs on site today: None  Radiologic Imaging: Reviewed     Assessment:  Karla Vance is a 48 y.o. female diagnosed with endometrial stromal sarcoma s/p laparoscopic supracervical hysterectomy with power and hand morecellation and laparoscopic bilateral salpingectomy on 04/05/18.   CT scan reassuring with no definite findings to suggest metastatic disease. Slightly enlarged lymph nodes may be due from recent surgery or malignancy, malignancy less likely.   Medical co-morbidities complicating care: Recent surgery  Plan:   Problem List Items Addressed This Visit      Genitourinary   Endometrial stromal sarcoma ([place])     We discussed options for management and agree with the plan for surgery and removal of the ovaries given she has a low grade endometrial stromal sarcoma. Postoperative adjuvant therapy will be based on pathology. She has been consented for surgery by Dr. Leonides Schanz. We have asked pathology do complete cytogenetic analyses to evaluate for chromosomal translocations as we want to correctly distinguish between LGESS and HGESS.   With regarding to lymphatic assessment, lymph nodes in the right pelvis are upper normal for size and may be due from recent surgery or represent malignancy. Typically lymph nodes are not assess for endometrial stromal sarcoma, however, lymph nodes may be positive in this disease. Correct assessment of nodal metastasis is limited due to changes in classification system for uterine stromal sarcomas. With the past classification system, HGESS may have been included in the 'true" LGESS group and inflated risks of nodal involvement. However, her lymph nodes on the right are slightly enlarged and sentinel node assessment may be optimal to identify lymph node most likely to be involved  with malignancy. We advised the patient that Dr. Fransisca Connors would make this decision intraoperatively.   We discussed menopausal issues and that we do not recommend ERT in this situation. Other options are available for menopausal management.   Suggested return to clinic in  4-6 weeks of postoperative evaluation and discussion pathology.    The patient's diagnosis,  an outline of the further diagnostic and laboratory studies which will be required, the recommendation for surgery, and alternatives were discussed with her and her accompanying family members.  All questions were answered to their satisfaction.  A total of 60 minutes were spent with the patient/family today; >50% was spent in education, counseling and coordination of care for LGESS   Marguerite Barba Gaetana Michaelis, MD    CC:  Referring Provider: Dr. Vikki Ports Ward

## 2018-04-24 NOTE — Patient Instructions (Signed)
Menopause  Menopause is the normal time of life when menstrual periods stop completely. It is usually confirmed by 12 months without a menstrual period. The transition to menopause (perimenopause) most often happens between the ages of 45 and 55. During perimenopause, hormone levels change in your body, which can cause symptoms and affect your health. Menopause may increase your risk for:   Loss of bone (osteoporosis), which causes bone breaks (fractures).   Depression.   Hardening and narrowing of the arteries (atherosclerosis), which can cause heart attacks and strokes.  What are the causes?  This condition is usually caused by a natural change in hormone levels that happens as you get older. The condition may also be caused by surgery to remove both ovaries (bilateral oophorectomy).  What increases the risk?  This condition is more likely to start at an earlier age if you have certain medical conditions or treatments, including:   A tumor of the pituitary gland in the brain.   A disease that affects the ovaries and hormone production.   Radiation treatment for cancer.   Certain cancer treatments, such as chemotherapy or hormone (anti-estrogen) therapy.   Heavy smoking and excessive alcohol use.   Family history of early menopause.  This condition is also more likely to develop earlier in women who are very thin.  What are the signs or symptoms?  Symptoms of this condition include:   Hot flashes.   Irregular menstrual periods.   Night sweats.   Changes in feelings about sex. This could be a decrease in sex drive or an increased comfort around your sexuality.   Vaginal dryness and thinning of the vaginal walls. This may cause painful intercourse.   Dryness of the skin and development of wrinkles.   Headaches.   Problems sleeping (insomnia).   Mood swings or irritability.   Memory problems.   Weight gain.   Hair growth on the face and chest.   Bladder infections or problems with urinating.  How  is this diagnosed?  This condition is diagnosed based on your medical history, a physical exam, your age, your menstrual history, and your symptoms. Hormone tests may also be done.  How is this treated?  In some cases, no treatment is needed. You and your health care provider should make a decision together about whether treatment is necessary. Treatment will be based on your individual condition and preferences. Treatment for this condition focuses on managing symptoms. Treatment may include:   Menopausal hormone therapy (MHT).   Medicines to treat specific symptoms or complications.   Acupuncture.   Vitamin or herbal supplements.  Before starting treatment, make sure to let your health care provider know if you have a personal or family history of:   Heart disease.   Breast cancer.   Blood clots.   Diabetes.   Osteoporosis.  Follow these instructions at home:  Lifestyle   Do not use any products that contain nicotine or tobacco, such as cigarettes and e-cigarettes. If you need help quitting, ask your health care provider.   Get at least 30 minutes of physical activity on 5 or more days each week.   Avoid alcoholic and caffeinated beverages, as well as spicy foods. This may help prevent hot flashes.   Get 7-8 hours of sleep each night.   If you have hot flashes, try:  ? Dressing in layers.  ? Avoiding things that may trigger hot flashes, such as spicy food, warm places, or stress.  ? Taking slow, deep   breaths when a hot flash starts.  ? Keeping a fan in your home and office.   Find ways to manage stress, such as deep breathing, meditation, or journaling.   Consider going to group therapy with other women who are having menopause symptoms. Ask your health care provider about recommended group therapy meetings.  Eating and drinking   Eat a healthy, balanced diet that contains whole grains, lean protein, low-fat dairy, and plenty of fruits and vegetables.   Your health care provider may recommend  adding more soy to your diet. Foods that contain soy include tofu, tempeh, and soy milk.   Eat plenty of foods that contain calcium and vitamin D for bone health. Items that are rich in calcium include low-fat milk, yogurt, beans, almonds, sardines, broccoli, and kale.  Medicines   Take over-the-counter and prescription medicines only as told by your health care provider.   Talk with your health care provider before starting any herbal supplements. If prescribed, take vitamins and supplements as told by your health care provider. These may include:  ? Calcium. Women age 51 and older should get 1,200 mg (milligrams) of calcium every day.  ? Vitamin D. Women need 600-800 International Units of vitamin D each day.  ? Vitamins B12 and B6. Aim for 50 micrograms of B12 and 1.5 mg of B6 each day.  General instructions   Keep track of your menstrual periods, including:  ? When they occur.  ? How heavy they are and how long they last.  ? How much time passes between periods.   Keep track of your symptoms, noting when they start, how often you have them, and how long they last.   Use vaginal lubricants or moisturizers to help with vaginal dryness and improve comfort during sex.   Keep all follow-up visits as told by your health care provider. This is important. This includes any group therapy or counseling.  Contact a health care provider if:   You are still having menstrual periods after age 55.   You have pain during sex.   You have not had a period for 12 months and you develop vaginal bleeding.  Get help right away if:   You have:  ? Severe depression.  ? Excessive vaginal bleeding.  ? Pain when you urinate.  ? A fast or irregular heart beat (palpitations).  ? Severe headaches.  ? Abdomen (abdominal) pain or severe indigestion.   You fell and you think you have a broken bone.   You develop leg or chest pain.   You develop vision problems.   You feel a lump in your breast.  Summary   Menopause is the normal  time of life when menstrual periods stop completely. It is usually confirmed by 12 months without a menstrual period.   The transition to menopause (perimenopause) most often happens between the ages of 45 and 55.   Symptoms can be managed through medicines, lifestyle changes, and complementary therapies such as acupuncture.   Eat a balanced diet that is rich in nutrients to promote bone health and heart health and to manage symptoms during menopause.  This information is not intended to replace advice given to you by your health care provider. Make sure you discuss any questions you have with your health care provider.  Document Released: 06/17/2003 Document Revised: 04/29/2016 Document Reviewed: 04/29/2016  Elsevier Interactive Patient Education  2019 [employer]

## 2018-04-24 NOTE — Progress Notes (Signed)
Irritation at night at times in vaginal area only. Just had surgery 3 weeks ago

## 2018-04-29 ENCOUNTER — Encounter
Admission: RE | Admit: 2018-04-29 | Discharge: 2018-04-29 | Disposition: A | Discharge status: Home / Self Care | Payer: BLUE CROSS/BLUE SHIELD | Source: Ambulatory Visit | Attending: Obstetrics & Gynecology | Admitting: Obstetrics & Gynecology

## 2018-04-29 ENCOUNTER — Other Ambulatory Visit: Payer: Self-pay

## 2018-04-29 HISTORY — DX: Gastro-esophageal reflux disease without esophagitis: K21.9

## 2018-04-29 NOTE — Patient Instructions (Signed)
Your procedure is scheduled on: 05/01/18 Wed  Report to Same Day Surgery 2nd floor medical mall [facility] Entrance-take elevator on left to 2nd floor.  Check in with surgery information desk.) To find out your arrival time please call [phone] between 1PM - 3PM on 04/30/18 Tues  Remember: Instructions that are not followed completely may result in serious medical risk, up to and including death, or upon the discretion of your surgeon and anesthesiologist your surgery may need to be rescheduled.    _x___ 1. Do not eat food after midnight the night before your procedure. You may drink clear liquids up to 2 hours before you are scheduled to arrive at the hospital for your procedure.  Do not drink clear liquids within 2 hours of your scheduled arrival to the hospital.  Clear liquids include  --Water or Apple juice without pulp  --Clear carbohydrate beverage such as ClearFast or Gatorade  --Black Coffee or Clear Tea (No milk, no creamers, do not add anything to                  the coffee or Tea Type 1 and type 2 diabetics should only drink water.   ____Ensure clear carbohydrate drink on the way to the hospital for bariatric patients  ____Ensure clear carbohydrate drink 3 hours before surgery for Dr Dwyane Luo patients if physician instructed.   No gum chewing or hard candies.     __x__ 2. No Alcohol for 24 hours before or after surgery.   __x__3. No Smoking or e-cigarettes for 24 prior to surgery.  Do not use any chewable tobacco products for at least 6 hour prior to surgery   ____  4. Bring all medications with you on the day of surgery if instructed.    __x__ 5. Notify your doctor if there is any change in your medical condition     (cold, fever, infections).    x___6. On the morning of surgery brush your teeth with toothpaste and water.  You may rinse your mouth with mouth wash if you wish.  Do not swallow any toothpaste or mouthwash.   Do not wear jewelry, make-up, hairpins,  clips or nail polish.  Do not wear lotions, powders, or perfumes. You may wear deodorant.  Do not shave 48 hours prior to surgery. Men may shave face and neck.  Do not bring valuables to the hospital.    [facility] is not responsible for any belongings or valuables.               Contacts, dentures or bridgework may not be worn into surgery.  Leave your suitcase in the car. After surgery it may be brought to your room.  For patients admitted to the hospital, discharge time is determined by your                       treatment team.  _  Patients discharged the day of surgery will not be allowed to drive home.  You will need someone to drive you home and stay with you the night of your procedure.    Please read over the following fact sheets that you were given:   [facility] Preparing for Surgery and or MRSA Information   _x___ Take anti-hypertensive listed below, cardiac, seizure, asthma,     anti-reflux and psychiatric medicines. These include:  1. thyroid (ARMOUR THYROID) 90 MG tablet  2.  3.  4.  5.  6.  ____Fleets  enema or Magnesium Citrate as directed.   ____ Use CHG Soap or sage wipes as directed on instruction sheet   ____ Use inhalers on the day of surgery and bring to hospital day of surgery  ____ Stop Metformin and Janumet 2 days prior to surgery.    ____ Take 1/2 of usual insulin dose the night before surgery and none on the morning     surgery.   _x___ Follow recommendations from Cardiologist, Pulmonologist or PCP regarding          stopping Aspirin, Coumadin, Plavix ,Eliquis, Effient, or Pradaxa, and Pletal.  X____Stop Anti-inflammatories such as Advil, Aleve, Ibuprofen, Motrin, Naproxen, Naprosyn, Goodies powders or aspirin products. OK to take Tylenol and                          Celebrex.   _x___ Stop supplements until after surgery.  But may continue Vitamin D, Vitamin B,       and multivitamin.   ____ Bring C-Pap to the hospital.

## 2018-04-29 NOTE — Pre-Procedure Instructions (Signed)
Phone call no answer, message left on answering machine.

## 2018-04-30 MED ORDER — CEFAZOLIN SODIUM-DEXTROSE 2-4 GM/100ML-% IV SOLN
2.0000 g | Freq: Once | INTRAVENOUS | Status: AC
  Administered 2018-05-01: 2 g via INTRAVENOUS

## 2018-05-01 ENCOUNTER — Ambulatory Visit: Payer: BLUE CROSS/BLUE SHIELD | Admitting: Anesthesiology

## 2018-05-01 ENCOUNTER — Ambulatory Visit
Admission: RE | Admit: 2018-05-01 | Discharge: 2018-05-01 | Disposition: A | Discharge status: Home / Self Care | Payer: BLUE CROSS/BLUE SHIELD | Attending: Obstetrics & Gynecology | Admitting: Obstetrics & Gynecology

## 2018-05-01 ENCOUNTER — Encounter
Admission: RE | Disposition: A | Discharge status: Home / Self Care | Payer: Self-pay | Source: Home / Self Care | Attending: Obstetrics & Gynecology

## 2018-05-01 ENCOUNTER — Other Ambulatory Visit: Payer: Self-pay

## 2018-05-01 ENCOUNTER — Encounter: Payer: Self-pay | Admitting: Emergency Medicine

## 2018-05-01 DIAGNOSIS — Z90711 Acquired absence of uterus with remaining cervical stump: Secondary | ICD-10-CM | POA: Diagnosis not present

## 2018-05-01 DIAGNOSIS — R590 Localized enlarged lymph nodes: Secondary | ICD-10-CM | POA: Insufficient documentation

## 2018-05-01 DIAGNOSIS — E039 Hypothyroidism, unspecified: Secondary | ICD-10-CM | POA: Diagnosis not present

## 2018-05-01 DIAGNOSIS — N888 Other specified noninflammatory disorders of cervix uteri: Secondary | ICD-10-CM | POA: Insufficient documentation

## 2018-05-01 DIAGNOSIS — N83201 Unspecified ovarian cyst, right side: Secondary | ICD-10-CM | POA: Diagnosis not present

## 2018-05-01 DIAGNOSIS — D649 Anemia, unspecified: Secondary | ICD-10-CM | POA: Diagnosis not present

## 2018-05-01 DIAGNOSIS — K660 Peritoneal adhesions (postprocedural) (postinfection): Secondary | ICD-10-CM | POA: Diagnosis not present

## 2018-05-01 DIAGNOSIS — E559 Vitamin D deficiency, unspecified: Secondary | ICD-10-CM | POA: Insufficient documentation

## 2018-05-01 DIAGNOSIS — N8300 Follicular cyst of ovary, unspecified side: Secondary | ICD-10-CM | POA: Insufficient documentation

## 2018-05-01 DIAGNOSIS — N83202 Unspecified ovarian cyst, left side: Secondary | ICD-10-CM | POA: Diagnosis not present

## 2018-05-01 DIAGNOSIS — Z79899 Other long term (current) drug therapy: Secondary | ICD-10-CM | POA: Diagnosis not present

## 2018-05-01 DIAGNOSIS — C541 Malignant neoplasm of endometrium: Secondary | ICD-10-CM

## 2018-05-01 HISTORY — PX: TRACHELECTOMY: SHX6586

## 2018-05-01 HISTORY — PX: OMENTECTOMY: SHX5985

## 2018-05-01 LAB — TYPE AND SCREEN
ABO/RH(D): A POS
Antibody Screen: NEGATIVE

## 2018-05-01 SURGERY — TRACHELECTOMY
Anesthesia: General

## 2018-05-01 MED ORDER — ACETAMINOPHEN 500 MG PO TABS
1000.0000 mg | ORAL_TABLET | Freq: Once | ORAL | Status: AC
  Administered 2018-05-01: 1000 mg via ORAL

## 2018-05-01 MED ORDER — FENTANYL CITRATE (PF) 100 MCG/2ML IJ SOLN
25.0000 ug | INTRAMUSCULAR | Status: DC | PRN
  Administered 2018-05-01 (×3): 25 ug via INTRAVENOUS

## 2018-05-01 MED ORDER — ONDANSETRON 4 MG PO TBDP: 4.0000 mg | ORAL_TABLET | Freq: Three times a day (TID) | ORAL | 0 refills | Status: AC | PRN

## 2018-05-01 MED ORDER — MIDAZOLAM HCL 2 MG/2ML IJ SOLN
INTRAMUSCULAR | Status: DC | PRN
  Administered 2018-05-01: 2 mg via INTRAVENOUS

## 2018-05-01 MED ORDER — LACTATED RINGERS IV SOLN
INTRAVENOUS | Status: DC
  Administered 2018-05-01: 07:00:00 via INTRAVENOUS

## 2018-05-01 MED ORDER — LACTATED RINGERS IV SOLN: INTRAVENOUS | Status: DC

## 2018-05-01 MED ORDER — DEXAMETHASONE SODIUM PHOSPHATE 10 MG/ML IJ SOLN
INTRAMUSCULAR | Status: AC
  Filled 2018-05-01: qty 1

## 2018-05-01 MED ORDER — SUGAMMADEX SODIUM 200 MG/2ML IV SOLN
INTRAVENOUS | Status: DC | PRN
  Administered 2018-05-01: 200 mg via INTRAVENOUS

## 2018-05-01 MED ORDER — PROPOFOL 10 MG/ML IV BOLUS
INTRAVENOUS | Status: AC
  Filled 2018-05-01: qty 40

## 2018-05-01 MED ORDER — CELECOXIB 200 MG PO CAPS
ORAL_CAPSULE | ORAL | Status: AC
  Administered 2018-05-01: 400 mg via ORAL
  Filled 2018-05-01: qty 2

## 2018-05-01 MED ORDER — HEPARIN SODIUM (PORCINE) 5000 UNIT/ML IJ SOLN
INTRAMUSCULAR | Status: AC
  Administered 2018-05-01: 5000 [IU] via SUBCUTANEOUS
  Filled 2018-05-01: qty 1

## 2018-05-01 MED ORDER — FENTANYL CITRATE (PF) 250 MCG/5ML IJ SOLN
INTRAMUSCULAR | Status: AC
  Filled 2018-05-01: qty 5

## 2018-05-01 MED ORDER — DEXAMETHASONE SODIUM PHOSPHATE 10 MG/ML IJ SOLN
INTRAMUSCULAR | Status: DC | PRN
  Administered 2018-05-01: 10 mg via INTRAVENOUS

## 2018-05-01 MED ORDER — ACETAMINOPHEN 500 MG PO TABS
ORAL_TABLET | ORAL | Status: AC
  Administered 2018-05-01: 1000 mg via ORAL
  Filled 2018-05-01: qty 2

## 2018-05-01 MED ORDER — SEVOFLURANE IN SOLN
RESPIRATORY_TRACT | Status: AC
  Filled 2018-05-01: qty 250

## 2018-05-01 MED ORDER — INDOCYANINE GREEN 25 MG IV SOLR
INTRAVENOUS | Status: DC | PRN
  Administered 2018-05-01: 20 mg via OPHTHALMIC

## 2018-05-01 MED ORDER — CEFAZOLIN SODIUM-DEXTROSE 2-4 GM/100ML-% IV SOLN
INTRAVENOUS | Status: AC
  Filled 2018-05-01: qty 100

## 2018-05-01 MED ORDER — ONDANSETRON HCL 4 MG/2ML IJ SOLN
INTRAMUSCULAR | Status: DC | PRN
  Administered 2018-05-01: 4 mg via INTRAVENOUS

## 2018-05-01 MED ORDER — KETOROLAC TROMETHAMINE 30 MG/ML IJ SOLN
30.0000 mg | Freq: Four times a day (QID) | INTRAMUSCULAR | Status: DC
  Administered 2018-05-01: 30 mg via INTRAVENOUS
  Filled 2018-05-01: qty 1

## 2018-05-01 MED ORDER — MIDAZOLAM HCL 2 MG/2ML IJ SOLN
INTRAMUSCULAR | Status: AC
  Filled 2018-05-01: qty 2

## 2018-05-01 MED ORDER — LIDOCAINE HCL (CARDIAC) PF 100 MG/5ML IV SOSY
PREFILLED_SYRINGE | INTRAVENOUS | Status: DC | PRN
  Administered 2018-05-01: 80 mg via INTRAVENOUS

## 2018-05-01 MED ORDER — FENTANYL CITRATE (PF) 100 MCG/2ML IJ SOLN
INTRAMUSCULAR | Status: DC | PRN
  Administered 2018-05-01: 50 ug via INTRAVENOUS
  Administered 2018-05-01: 100 ug via INTRAVENOUS
  Administered 2018-05-01: 50 ug via INTRAVENOUS

## 2018-05-01 MED ORDER — CELECOXIB 200 MG PO CAPS
400.0000 mg | ORAL_CAPSULE | Freq: Once | ORAL | Status: AC
  Administered 2018-05-01: 400 mg via ORAL

## 2018-05-01 MED ORDER — ONDANSETRON HCL 4 MG/2ML IJ SOLN: 4.0000 mg | Freq: Once | INTRAMUSCULAR | Status: DC | PRN

## 2018-05-01 MED ORDER — ROCURONIUM BROMIDE 50 MG/5ML IV SOLN
INTRAVENOUS | Status: AC
  Filled 2018-05-01: qty 1

## 2018-05-01 MED ORDER — OXYCODONE HCL 5 MG PO TABS
5.0000 mg | ORAL_TABLET | ORAL | Status: DC | PRN
  Administered 2018-05-01: 5 mg via ORAL
  Filled 2018-05-01: qty 1

## 2018-05-01 MED ORDER — BUPIVACAINE HCL (PF) 0.5 % IJ SOLN
INTRAMUSCULAR | Status: AC
  Filled 2018-05-01: qty 30

## 2018-05-01 MED ORDER — BUPIVACAINE LIPOSOME 1.3 % IJ SUSP
INTRAMUSCULAR | Status: DC | PRN
  Administered 2018-05-01: 20 mL

## 2018-05-01 MED ORDER — FAMOTIDINE 20 MG PO TABS
ORAL_TABLET | ORAL | Status: AC
  Administered 2018-05-01: 20 mg via ORAL
  Filled 2018-05-01: qty 1

## 2018-05-01 MED ORDER — PROPOFOL 10 MG/ML IV BOLUS
INTRAVENOUS | Status: DC | PRN
  Administered 2018-05-01: 150 mg via INTRAVENOUS
  Administered 2018-05-01: 30 mg via INTRAVENOUS

## 2018-05-01 MED ORDER — OXYCODONE HCL 5 MG PO TABS
ORAL_TABLET | ORAL | Status: AC
  Filled 2018-05-01: qty 1

## 2018-05-01 MED ORDER — MORPHINE SULFATE (PF) 4 MG/ML IV SOLN: 1.0000 mg | INTRAVENOUS | Status: DC | PRN

## 2018-05-01 MED ORDER — PROPOFOL 500 MG/50ML IV EMUL
INTRAVENOUS | Status: DC | PRN
  Administered 2018-05-01: 20 ug/kg/min via INTRAVENOUS

## 2018-05-01 MED ORDER — KETOROLAC TROMETHAMINE 30 MG/ML IJ SOLN
INTRAMUSCULAR | Status: AC
  Administered 2018-05-01: 30 mg via INTRAVENOUS
  Filled 2018-05-01: qty 1

## 2018-05-01 MED ORDER — EPHEDRINE SULFATE 50 MG/ML IJ SOLN
INTRAMUSCULAR | Status: AC
  Filled 2018-05-01: qty 1

## 2018-05-01 MED ORDER — GABAPENTIN 300 MG PO CAPS
ORAL_CAPSULE | ORAL | Status: AC
  Administered 2018-05-01: 600 mg via ORAL
  Filled 2018-05-01: qty 2

## 2018-05-01 MED ORDER — ONDANSETRON HCL 4 MG/2ML IJ SOLN
INTRAMUSCULAR | Status: AC
  Filled 2018-05-01: qty 2

## 2018-05-01 MED ORDER — ROCURONIUM BROMIDE 100 MG/10ML IV SOLN
INTRAVENOUS | Status: DC | PRN
  Administered 2018-05-01: 40 mg via INTRAVENOUS
  Administered 2018-05-01: 10 mg via INTRAVENOUS
  Administered 2018-05-01: 20 mg via INTRAVENOUS

## 2018-05-01 MED ORDER — FAMOTIDINE 20 MG PO TABS
20.0000 mg | ORAL_TABLET | Freq: Once | ORAL | Status: AC
  Administered 2018-05-01: 20 mg via ORAL

## 2018-05-01 MED ORDER — BUPIVACAINE LIPOSOME 1.3 % IJ SUSP
INTRAMUSCULAR | Status: AC
  Filled 2018-05-01: qty 20

## 2018-05-01 MED ORDER — SUGAMMADEX SODIUM 200 MG/2ML IV SOLN
INTRAVENOUS | Status: AC
  Filled 2018-05-01: qty 2

## 2018-05-01 MED ORDER — FENTANYL CITRATE (PF) 100 MCG/2ML IJ SOLN
INTRAMUSCULAR | Status: AC
  Administered 2018-05-01: 25 ug via INTRAVENOUS
  Filled 2018-05-01: qty 2

## 2018-05-01 MED ORDER — EPHEDRINE SULFATE 50 MG/ML IJ SOLN
INTRAMUSCULAR | Status: DC | PRN
  Administered 2018-05-01 (×2): 10 mg via INTRAVENOUS

## 2018-05-01 MED ORDER — OXYCODONE HCL 5 MG PO TABS: 5.0000 mg | ORAL_TABLET | ORAL | 0 refills | Status: AC | PRN

## 2018-05-01 MED ORDER — GABAPENTIN 300 MG PO CAPS
600.0000 mg | ORAL_CAPSULE | Freq: Once | ORAL | Status: AC
  Administered 2018-05-01: 600 mg via ORAL

## 2018-05-01 MED ORDER — INDOCYANINE GREEN 25 MG IV SOLR
INTRAVENOUS | Status: AC
  Filled 2018-05-01: qty 25

## 2018-05-01 MED ORDER — LIDOCAINE HCL (PF) 2 % IJ SOLN
INTRAMUSCULAR | Status: AC
  Filled 2018-05-01: qty 10

## 2018-05-01 MED ORDER — HEPARIN SODIUM (PORCINE) 5000 UNIT/ML IJ SOLN
5000.0000 [IU] | Freq: Once | INTRAMUSCULAR | Status: AC
  Administered 2018-05-01: 5000 [IU] via SUBCUTANEOUS

## 2018-05-01 MED ORDER — IBUPROFEN 800 MG PO TABS: 800.0000 mg | ORAL_TABLET | Freq: Four times a day (QID) | ORAL | 1 refills | Status: AC

## 2018-05-01 SURGICAL SUPPLY — 64 items
BAG URINE DRAINAGE (UROLOGICAL SUPPLIES) ×5 IMPLANT
BLADE SURG SZ10 CARB STEEL (BLADE) ×5 IMPLANT
BLADE SURG SZ11 CARB STEEL (BLADE) ×5 IMPLANT
CANISTER SUCT 1200ML W/VALVE (MISCELLANEOUS) ×5 IMPLANT
CATH FOLEY 2WAY  5CC 16FR (CATHETERS) ×2
CATH URTH 16FR FL 2W BLN LF (CATHETERS) ×3 IMPLANT
CHLORAPREP W/TINT 26ML (MISCELLANEOUS) ×5 IMPLANT
COVER WAND RF STERILE (DRAPES) ×3 IMPLANT
DERMABOND ADVANCED (GAUZE/BANDAGES/DRESSINGS) ×2
DERMABOND ADVANCED .7 DNX12 (GAUZE/BANDAGES/DRESSINGS) ×3 IMPLANT
DRAPE LEGGINS SURG 28X43 STRL (DRAPES) ×5 IMPLANT
DRAPE PERI LITHO V/GYN (MISCELLANEOUS) ×5 IMPLANT
DRAPE SHEET LG 3/4 BI-LAMINATE (DRAPES) ×5 IMPLANT
DRAPE SURG 17X11 SM STRL (DRAPES) ×5 IMPLANT
DRAPE UNDER BUTTOCK W/FLU (DRAPES) ×5 IMPLANT
DRSG TELFA 3X8 NADH (GAUZE/BANDAGES/DRESSINGS) ×5 IMPLANT
DRSG TELFA 4X3 1S NADH ST (GAUZE/BANDAGES/DRESSINGS) IMPLANT
ELECT REM PT RETURN 9FT ADLT (ELECTROSURGICAL) ×5
ELECTRODE REM PT RTRN 9FT ADLT (ELECTROSURGICAL) ×3 IMPLANT
GAUZE 4X4 16PLY RFD (DISPOSABLE) ×5 IMPLANT
GLOVE PI ORTHOPRO 6.5 (GLOVE) ×2
GLOVE PI ORTHOPRO STRL 6.5 (GLOVE) ×3 IMPLANT
GLOVE SURG SYN 6.5 ES PF (GLOVE) ×5 IMPLANT
GLOVE SURG SYN 6.5 PF PI (GLOVE) ×3 IMPLANT
GOWN STRL REUS W/ TWL LRG LVL3 (GOWN DISPOSABLE) ×6 IMPLANT
GOWN STRL REUS W/TWL LRG LVL3 (GOWN DISPOSABLE) ×4
GRASPER SUT TROCAR 14GX15 (MISCELLANEOUS) ×2 IMPLANT
IRRIGATION STRYKERFLOW (MISCELLANEOUS) IMPLANT
IRRIGATOR STRYKERFLOW (MISCELLANEOUS)
IV LACTATED RINGERS 1000ML (IV SOLUTION) IMPLANT
KIT PINK PAD W/HEAD ARE REST (MISCELLANEOUS) ×5
KIT PINK PAD W/HEAD ARM REST (MISCELLANEOUS) ×3 IMPLANT
KIT TURNOVER CYSTO (KITS) ×5 IMPLANT
LABEL OR SOLS (LABEL) ×5 IMPLANT
LIGASURE VESSEL 5MM BLUNT TIP (ELECTROSURGICAL) ×2 IMPLANT
NDL SAFETY ECLIPSE 18X1.5 (NEEDLE) ×3 IMPLANT
NEEDLE HYPO 18GX1.5 SHARP (NEEDLE) ×2
NEEDLE HYPO 22GX1.5 SAFETY (NEEDLE) ×5 IMPLANT
NS IRRIG 500ML POUR BTL (IV SOLUTION) ×5 IMPLANT
PACK BASIN MINOR ARMC (MISCELLANEOUS) ×5 IMPLANT
PACK LAP CHOLECYSTECTOMY (MISCELLANEOUS) ×5 IMPLANT
PAD DRESSING TELFA 3X8 NADH (GAUZE/BANDAGES/DRESSINGS) IMPLANT
PAD OB MATERNITY 4.3X12.25 (PERSONAL CARE ITEMS) ×5 IMPLANT
PAD PREP 24X41 OB/GYN DISP (PERSONAL CARE ITEMS) ×5 IMPLANT
POUCH ENDO CATCH 10MM SPEC (MISCELLANEOUS) ×4 IMPLANT
POUCH SPECIMEN RETRIEVAL 10MM (ENDOMECHANICALS) ×7 IMPLANT
SHEARS ENDO 5MM 31CM (CUTTER) ×2 IMPLANT
SLEEVE ENDOPATH XCEL 5M (ENDOMECHANICALS) ×7 IMPLANT
STRAP SAFETY 5IN WIDE (MISCELLANEOUS) ×5 IMPLANT
SUT ETHIBOND NAB CT1 #1 30IN (SUTURE) ×5 IMPLANT
SUT MNCRL 4-0 (SUTURE)
SUT MNCRL 4-0 27XMFL (SUTURE)
SUT VIC AB 0 CT1 27 (SUTURE) ×6
SUT VIC AB 0 CT1 27XCR 8 STRN (SUTURE) ×9 IMPLANT
SUT VIC AB 0 CT1 36 (SUTURE) ×15 IMPLANT
SUT VIC AB 1 CT1 36 (SUTURE) ×5 IMPLANT
SUT VIC AB 2-0 CT1 (SUTURE) ×5 IMPLANT
SUTURE MNCRL 4-0 27XMF (SUTURE) IMPLANT
SYR 10ML LL (SYRINGE) ×5 IMPLANT
SYR CONTROL 10ML (SYRINGE) ×5 IMPLANT
TAPE TRANSPORE STRL 2 31045 (GAUZE/BANDAGES/DRESSINGS) ×2 IMPLANT
TROCAR ENDO BLADELESS 11MM (ENDOMECHANICALS) IMPLANT
TROCAR XCEL NON-BLD 5MMX100MML (ENDOMECHANICALS) ×5 IMPLANT
TUBING INSUFFLATION (TUBING) ×5 IMPLANT

## 2018-05-01 NOTE — Anesthesia Postprocedure Evaluation (Signed)
Anesthesia Post Note  Patient: Karla Vance  Procedure(s) Performed: TRACHELECTOMY, VAGINAL (N/A ) LAPAROSCOPIC OOPHORECTOMY, SENTINEL BILATERAL PELVIC LYMPH NODE INJECTION AND MAPPING (Bilateral ) OMENTECTOMY, PARTIAL  Patient location during evaluation: PACU Anesthesia Type: General Level of consciousness: awake and alert and oriented Pain management: pain level controlled Vital Signs Assessment: post-procedure vital signs reviewed and stable Respiratory status: spontaneous breathing Cardiovascular status: blood pressure returned to baseline Anesthetic complications: no     Last Vitals:  Vitals:   05/01/18 1138 05/01/18 1239  BP: 128/67 107/66  Pulse: 67 73  Resp: 16 16  Temp: 36.6 C (!) 36.2 C  SpO2: 100% 100%    Last Pain:  Vitals:   05/01/18 1239  TempSrc: Temporal  PainSc: 1                  Azayla Polo

## 2018-05-01 NOTE — Transfer of Care (Signed)
Immediate Anesthesia Transfer of Care Note  Patient: Karla Vance  Procedure(s) Performed: TRACHELECTOMY, VAGINAL (N/A ) LAPAROSCOPIC OOPHORECTOMY, SENTINEL BILATERAL PELVIC LYMPH NODE INJECTION AND MAPPING (Bilateral ) OMENTECTOMY, PARTIAL  Patient Location: PACU  Anesthesia Type:General  Level of Consciousness: drowsy  Airway & Oxygen Therapy: Patient Spontanous Breathing and Patient connected to face mask oxygen  Post-op Assessment: Report given to RN and Post -op Vital signs reviewed and stable  Post vital signs: stable  Last Vitals:  Vitals Value Taken Time  BP 129/72 05/01/2018 10:47 AM  Temp 36.8 C 05/01/2018 10:47 AM  Pulse 79 05/01/2018 10:53 AM  Resp 16 05/01/2018 10:53 AM  SpO2 100 % 05/01/2018 10:53 AM  Vitals shown include unvalidated device data.  Last Pain:  Vitals:   05/01/18 0627  TempSrc: Tympanic  PainSc: 0-No pain         Complications: No apparent anesthesia complications

## 2018-05-01 NOTE — Anesthesia Procedure Notes (Addendum)
Procedure Name: Intubation Date/Time: 05/01/2018 8:27 AM Performed by: Lavone Orn, CRNA Pre-anesthesia Checklist: Patient identified, Emergency Drugs available, Suction available, Patient being monitored and Timeout performed Patient Re-evaluated:Patient Re-evaluated prior to induction Oxygen Delivery Method: Circle system utilized Preoxygenation: Pre-oxygenation with 100% oxygen Induction Type: IV induction Ventilation: Mask ventilation without difficulty Laryngoscope Size: Mac and 4 Grade View: Grade II Tube type: Oral Tube size: 7.0 mm Number of attempts: 1 Airway Equipment and Method: Stylet (cricoid pressure) Placement Confirmation: ETT inserted through vocal cords under direct vision,  positive ETCO2 and breath sounds checked- equal and bilateral Secured at: 21 (at teeth) cm Tube secured with: Tape Dental Injury: Teeth and Oropharynx as per pre-operative assessment

## 2018-05-01 NOTE — H&P (Signed)
Karla Vance is a 48 y.o. 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  CT scan reassuring with no definite findings to suggest metastatic disease. Patient seen and examined and no change in plan for trachylectomy, bilateral oophorectomy, and staging biopsies including possible pelvic SLN biopsies and pelvic/PA node dissection.  I discussed plan for surgery with patient and her husband. Mellody Drown, MD

## 2018-05-01 NOTE — Anesthesia Preprocedure Evaluation (Signed)
Anesthesia Evaluation  Patient identified by MRN, date of birth, ID band Patient awake    Reviewed: Allergy & Precautions, H&P , NPO status , Patient's Chart, lab work & pertinent test results, reviewed documented beta blocker date and time   Airway Mallampati: II  TM Distance: >3 FB Neck ROM: full    Dental  (+) Teeth Intact   Pulmonary neg pulmonary ROS, pneumonia, resolved,    Pulmonary exam normal        Cardiovascular Exercise Tolerance: Good negative cardio ROS Normal cardiovascular exam Rhythm:regular Rate:Normal     Neuro/Psych negative neurological ROS  negative psych ROS   GI/Hepatic Neg liver ROS, GERD  ,  Endo/Other  negative endocrine ROSHypothyroidism   Renal/GU negative Renal ROS  negative genitourinary   Musculoskeletal  (+) Arthritis , Osteoarthritis,    Abdominal   Peds  Hematology negative hematology ROS (+) Blood dyscrasia, anemia ,   Anesthesia Other Findings Past Medical History: No date: Anemia No date: Arthritis No date: Hypothyroidism No date: Pneumonia     Comment:  as a child No date: Thyroid disease No date: Vitamin D deficiency Past Surgical History: No date: NASAL SEPTUM SURGERY No date: TONSILLECTOMY BMI    Body Mass Index:  28.07 kg/m     Reproductive/Obstetrics negative OB ROS                             Anesthesia Physical  Anesthesia Plan  ASA: II  Anesthesia Plan: General   Post-op Pain Management:    Induction: Intravenous  PONV Risk Score and Plan:   Airway Management Planned: Oral ETT  Additional Equipment:   Intra-op Plan:   Post-operative Plan: Extubation in OR  Informed Consent: I have reviewed the patients History and Physical, chart, labs and discussed the procedure including the risks, benefits and alternatives for the proposed anesthesia with the patient or authorized representative who has indicated his/her  understanding and acceptance.     Dental Advisory Given  Plan Discussed with: CRNA  Anesthesia Plan Comments:         Anesthesia Quick Evaluation

## 2018-05-01 NOTE — Progress Notes (Addendum)
Karla Vance was presented at [facility] tumor board today. She underwent completion surgery today, 05/01/18. Trachelectomy, lap oophorectomy and sentinel bilateral pelvic lymph node injection and mapping. Recommendation is to await final pathology from surgery today. Cytogenetic analysis has been sent to [facility] clinic and is pending. She will follow up in clinic to review pathology.

## 2018-05-01 NOTE — Anesthesia Post-op Follow-up Note (Signed)
Anesthesia QCDR form completed.        

## 2018-05-01 NOTE — Discharge Instructions (Addendum)
Discharge instructions (they are mostly the same as before):  Call office if you have any of the following: fever >101 F, chills, shortness of breath, excessive vaginal bleeding, incision drainage or problems, leg pain or redness, or any other concerns.   Activity: Do not lift > 10 lbs for 8 weeks.  No intercourse or tampons for 8 weeks.  No driving for 1-2 weeks, or until you are certain you can slam on the brakes.   You may feel some pain in your upper right abdomen/rib and right shoulder.  This is from the gas in the abdomen for surgery. This will subside over time, please be patient!  Take 800mg  Ibuprofen and 1000mg  Tylenol around the clock, every 6 hours for at least the first 3-5 days.  After this you can take as needed.  This will help decrease inflammation and promote healing.  The narcotics you'll take every 4 hours, just as needed, as they just trick your brain into thinking its not in pain.    Please don't limit yourself in terms of routine activity.  You will be able to do most things, although they may take longer to do or be a little painful.  You can do it!  Don't be a hero, but don't be a wimp either!    AMBULATORY SURGERY  DISCHARGE INSTRUCTIONS   1) The drugs that you were given will stay in your system until tomorrow so for the next 24 hours you should not:  A) Drive an automobile B) Make any legal decisions C) Drink any alcoholic beverage   2) You may resume regular meals tomorrow.  Today it is better to start with liquids and gradually work up to solid foods.  You may eat anything you prefer, but it is better to start with liquids, then soup and crackers, and gradually work up to solid foods.   3) Please notify your doctor immediately if you have any unusual bleeding, trouble breathing, redness and pain at the surgery site, drainage, fever, or pain not relieved by medication.    4) Additional Instructions:        Please contact your physician with any  problems or Same Day Surgery at [phone], Monday through Friday 6 am to 4 pm, or [place] at [facility] number at [phone].

## 2018-05-01 NOTE — H&P (Signed)
Preoperative History and Physical  Karla Vance is a 48 y.o. G6P6 here for surgical management of endometrial stromal sarcoma.   No significant preoperative concerns.  Proposed surgery: laparoscopic bilateral oophorectomy, trachelectomy, and sentinel pelvic lymph node mapping/sampling  Past Medical History:  Diagnosis Date  . Anemia   . Arthritis   . Cancer [facility])    uterine cancer, sarcoma   . Collagen vascular disease (HCC)    RA  . GERD (gastroesophageal reflux disease)   . Hypothyroidism   . Pneumonia    as a child  . Thyroid disease   . Vitamin D deficiency    Past Surgical History:  Procedure Laterality Date  . LAPAROSCOPIC BILATERAL SALPINGECTOMY Bilateral 04/05/2018   Procedure: LAPAROSCOPIC BILATERAL SALPINGECTOMY;  Surgeon: Ward, Honor Loh, MD;  Location: [facility] ORS;  Service: Gynecology;  Laterality: Bilateral;  . LAPAROSCOPIC SUPRACERVICAL HYSTERECTOMY N/A 04/05/2018   Procedure: LAPAROSCOPIC SUPRACERVICAL HYSTERECTOMY, with powe morcellation;  Surgeon: Ward, Honor Loh, MD;  Location: [facility] ORS;  Service: Gynecology;  Laterality: N/A;  . NASAL SEPTUM SURGERY    . TONSILLECTOMY     OB History  Gravida Para Term Preterm AB Living  6 6          SAB TAB Ectopic Multiple Live Births               # Outcome Date GA Lbr Len/2nd Weight Sex Delivery Anes PTL Lv  6 Para           5 Para           4 Para           3 Para           2 Para           1 Para             Obstetric Comments  NSVD x 6  Patient denies any other pertinent gynecologic issues.   No current facility-administered medications on file prior to encounter.    Current Outpatient Medications on File Prior to Encounter  Medication Sig Dispense Refill  . acetaminophen (TYLENOL) 500 MG tablet Take 500 mg by mouth daily as needed for moderate pain or headache.    . Cholecalciferol (VITAMIN D3) 25 MCG (1000 UT) CAPS Take 1,000 Units by mouth 2 (two) times daily.    . ferrous sulfate 325 (65 FE) MG tablet  Take 325 mg by mouth daily at 2 PM.     . folic acid (FOLVITE) 1 MG tablet Take 1 mg by mouth daily at 2 PM.     . methotrexate (RHEUMATREX) 2.5 MG tablet Take 20 mg by mouth every Wednesday. Caution:Chemotherapy. Protect from light.    . thyroid (ARMOUR THYROID) 90 MG tablet Take 90 mg by mouth daily.     Marland Kitchen ibuprofen (ADVIL,MOTRIN) 800 MG tablet Take 1 tablet (800 mg total) by mouth every 6 (six) hours. (Patient not taking: Reported on 04/24/2018) 45 tablet 0  . oxyCODONE (ROXICODONE) 5 MG immediate release tablet Take 1 tablet (5 mg total) by mouth every 8 (eight) hours as needed. (Patient not taking: Reported on 04/29/2018) 16 tablet 0   No Known Allergies  Social History:   reports that she has never smoked. She has never used smokeless tobacco. She reports current alcohol use of about 2.0 standard drinks of alcohol per week. She reports that she does not use drugs.  Family History  Problem Relation Age of Onset  . Non-Hodgkin's lymphoma Mother   .  Heart disease Father   . CAD Father 55       CABG x 3 vessels    Review of Systems: Noncontributory  PHYSICAL EXAM: Blood pressure 137/85, pulse 75, temperature (!) 97 F (36.1 C), temperature source Tympanic, resp. rate 17, height 5\' 5"  (1.651 m), weight 76.7 kg, SpO2 100 %. General appearance - alert, well appearing, and in no distress Chest - clear to auscultation, no wheezes, rales or rhonchi, symmetric air entry Heart - normal rate and regular rhythm Abdomen - soft, nontender, nondistended, no masses or organomegaly Pelvic - examination not indicated Extremities - peripheral pulses normal, no pedal edema, no clubbing or cyanosis  Labs: No results found for this or any previous visit (from the past 336 hour(s)).  Imaging Studies: Ct Abdomen Pelvis W Contrast  Result Date: 04/24/2018 CLINICAL DATA:  Endometrial sarcoma. EXAM: CT ABDOMEN AND PELVIS WITH CONTRAST TECHNIQUE: Multidetector CT imaging of the abdomen and pelvis was  performed using the standard protocol following bolus administration of intravenous contrast. CONTRAST:  122mL OMNIPAQUE IOHEXOL 300 MG/ML  SOLN COMPARISON:  None. FINDINGS: Lower chest: Unremarkable. Hepatobiliary: No focal abnormality within the liver parenchyma. There is no evidence for gallstones, gallbladder wall thickening, or pericholecystic fluid. No intrahepatic or extrahepatic biliary dilation. Pancreas: No focal mass lesion. No dilatation of the main duct. No intraparenchymal cyst. No peripancreatic edema. Spleen: No splenomegaly. No focal mass lesion. Adrenals/Urinary Tract: No adrenal nodule or mass. 2.4 cm interpolar right renal lesion compatible with a cyst. Tiny low-density lesion upper pole left kidney is too small to characterize but likely benign and probably a cyst. No evidence for hydroureter. The urinary bladder appears normal for the degree of distention. Stomach/Bowel: Stomach is nondistended. No gastric wall thickening. No evidence of outlet obstruction. Duodenum is normally positioned as is the ligament of Treitz. No small bowel wall thickening. No small bowel dilatation. The terminal ileum is normal. The appendix is normal. No gross colonic mass. No colonic wall thickening. Vascular/Lymphatic: No abdominal aortic aneurysm. No abdominal aortic atherosclerotic calcification. There is no gastrohepatic or hepatoduodenal ligament lymphadenopathy. No intraperitoneal or retroperitoneal lymphadenopathy. 9 mm short axis upper normal right external iliac node seen on image 62/2. 7 mm short axis right common femoral node seen on 52/2. Upper normal lymph nodes seen in both common femoral chains. No groin lymphadenopathy. Reproductive: Status post hysterectomy.  There is no adnexal mass. Other: Small volume free fluid noted in the cul-de-sac. Musculoskeletal: No worrisome lytic or sclerotic osseous abnormality. IMPRESSION: 1. No definite findings to suggest metastatic disease in the abdomen or pelvis.  Lymph nodes in the right pelvis are upper normal for size and close attention on follow-up recommended. 2. Small volume free fluid in the cul-de-sac, nonspecific. Electronically Signed   By: Misty Stanley M.D.   On: 04/24/2018 11:05    Assessment: Patient Active Problem List   Diagnosis Date Noted  . Endometrial stromal sarcoma ([place]) 04/24/2018  . Fibroid uterus 04/05/2018  . Menorrhagia 04/05/2018    Plan: Patient will undergo surgical management with laparoscopic Bilateral oophorectomy, trachelectomy, and sentinel lymph node mapping/biopsy.   The risks of surgery were discussed in detail with the patient including but not limited to: bleeding which may require transfusion or reoperation; infection which may require antibiotics; injury to surrounding organs which may involve bowel, bladder, ureters ; need for additional procedures including laparoscopy or laparotomy; thromboembolic phenomenon, surgical site problems and other postoperative/anesthesia complications. Likelihood of success in alleviating the patient's condition was discussed. Routine  postoperative instructions will be reviewed with the patient and her family in detail after surgery.  The patient concurred with the proposed plan, giving informed written consent for the surgery.  Patient has been NPO since last night she will remain NPO for procedure.  Anesthesia and OR aware.  Preoperative prophylactic antibiotics and SCDs ordered on call to the OR.  To OR when ready.  ----- Larey Days, MD Attending Obstetrician and Gynecologist [facility], Department of [facility]

## 2018-05-03 LAB — CYTOLOGY - NON PAP

## 2018-05-03 LAB — SURGICAL PATHOLOGY

## 2018-05-03 NOTE — Op Note (Signed)
Total Laparoscopic Hysterectomy Operative Note Procedure Date: 05/01/2018  Patient:  Karla Vance  48 y.o. female  PRE-OPERATIVE DIAGNOSIS:  endometrial stromal sarcoma  POST-OPERATIVE DIAGNOSIS:  endometrial stromal sarcoma  PROCEDURE:  Procedure(s): TRACHELECTOMY, VAGINAL (N/A) LAPAROSCOPIC OOPHORECTOMY, SENTINEL BILATERAL PELVIC LYMPH NODE INJECTION AND MAPPING (Bilateral) OMENTECTOMY, PARTIAL  SURGEON:  Surgeon(s) and Role:    * Versa Craton, Honor Loh, MD - Primary    Mellody Drown, MD - Assisting  ANESTHESIA:  General via ET  I/O  See flowsheet  FINDINGS:    Omental adhesions to previous incision site midline Filmy adhesions of bowel to pelvic wall bilaterally and midline at cervix.  Normal appearing ovaries bilaterally Normal appearing cervix  Enlarged bilateral sentinel pelvic lymph nodes  SPECIMEN:  1. Cervix 2. Left external iliac sentinel node 3. Right external iliac sentinel node 4. Bilateral ovaries 5. Portion of omentum 6. Anterior bladder wall peritoneum 7. Pelvic washings  COMPLICATIONS: none apparent  DISPOSITION: vital signs stable to PACU  Indication for Surgery: 48 y.o. G6P6 who underwent a supracervical hysterectomy on 04/05/18 and was diagnosed with low grade endometrial stromal sarcoma.  Due to the retention of her cervix, and potential of stromal disease there, it was recommended that she have a completion surgery with oophorectomy to eliminate change of residual disease.    Risks of surgery were discussed with the patient including but not limited to: bleeding which may require transfusion or reoperation; infection which may require antibiotics; injury to bowel, bladder, ureters or other surrounding organs; need for additional procedures including laparotomy, blood clot, incisional problems and other postoperative/anesthesia complications. Written informed consent was obtained.    PROCEDURE IN DETAIL:  The patient had 5000u Heparin Sub-q and  sequential compression devices applied to her lower extremities while in the preoperative area.  She was then taken to the operating room. IV antibiotics were given. General anesthesia was administered via endotracheal route.  She was placed in the dorsal lithotomy position, and was prepped and draped in a sterile manner. A surgical time-out was performed.  A Foley catheter was inserted into her bladder and attached to constant drainage.  A speculum was placed in the vagina and cervix visualized.  2cc of IC-Green was injected at 3:00 and also at 9:00 for sentinel lymph node mapping.  The gloves were changed, and attention was turned to the abdomen where an umbilical incision was made with the scalpel.  A 20mm trochar was inserted in the umbilical incision using a visiport method.Opening pressure was 21mmHg, and the abdomen was insufflated to 46mmHg carbon dioxide gas and adequate pneumoperitoneum was obtained. A survey of the patient's pelvis and abdomen revealed the findings as mentioned above. Two 67mm ports were inserted in the lower left and right quadrants, under visualization.  Pelvic washing were obtained.  The ligasure was used to dissect the adherent omentum from the previous suprapubic port site.  Once this was taken down an 21mm port was inserted suprapubically. A portion of the omentum was obtained and placed in an endocatch bag.   The bilateral ureters were visualized vermiculating transperitoneally.  The bilateral IP ligaments were skeletonized and cauterized and divided using the ligasure. The bilateral ovaries were placed into a separate endocatch bag. Bilaterally, the peritoneum was drawn medially and the underlying tissues divided to expose the external iliac lymph nodes. The camera was switched to SPY fluorescence mode and the sentinel nodes were identified.  The endoshears and ligasure were used to dissect out the lymph nodes without damage  to the surrounding tissues, arteries and veins.  They  were individually sent to pathology.  The sites remained hemostatic. The omentum and ovaries were then were drawn up through the suprapubic site and removed from the abdomen.   The incision here was closed with 0-vicryl using the inlet closure device.  The pneumoperitoneum was recreated and surgical site inspected, and found to be hemostatic. Bilateral ureters were visualized vermiuclating. No intraoperative injury to surrounding organs was noted. The abdomen was desufflated and all instruments were then removed. All skin incisions were closed with 4-0 monocryl and covered with surgical glue.  The weighted speculum was inserted into the vagina, and thyroid clamps were placed on the anterior and posterior cervix.  The posterior vagina was grasped with an Alis clamp and scissors were used to enter the posterior peritoneum.  The long billed weighted speculum was then inserted above the rectum.  Haney clamps were then used sequentially to ligate and transect the bilateral uterosacral ligaments and cardinal ligaments. 0-Vicryl was used in a Actuary with each bite. Bovie cautery was used to separate the remaining tissues circumferentially around the remaining portion of the cervix. Hemostasis was noted at all pedicles.  The vaginal mucosa closed anterior to posterior with a running stitch of 0-vicryl.  Long-acting bupivacaine was injected into the vaginal mucosa and underlying tissues.    The patient tolerated the procedure well.  All instruments, needles, and sponge counts were correct x 2. The foley was removed in the OR.  The patient was taken to the recovery room in stable condition.   ----- Larey Days, MD Attending Obstetrician and Gynecologist [facility], Department of [facility]

## 2018-05-08 ENCOUNTER — Encounter: Payer: Self-pay | Admitting: Obstetrics & Gynecology

## 2018-05-29 ENCOUNTER — Inpatient Hospital Stay: Payer: BLUE CROSS/BLUE SHIELD | Attending: Obstetrics and Gynecology | Admitting: Obstetrics and Gynecology

## 2018-05-29 ENCOUNTER — Other Ambulatory Visit: Payer: Self-pay

## 2018-05-29 VITALS — BP 149/88 | HR 76 | Temp 96.7°F | Resp 18 | Wt 172.0 lb

## 2018-05-29 DIAGNOSIS — Z90722 Acquired absence of ovaries, bilateral: Secondary | ICD-10-CM

## 2018-05-29 DIAGNOSIS — C541 Malignant neoplasm of endometrium: Secondary | ICD-10-CM

## 2018-05-29 DIAGNOSIS — Z9079 Acquired absence of other genital organ(s): Secondary | ICD-10-CM

## 2018-05-29 DIAGNOSIS — I998 Other disorder of circulatory system: Secondary | ICD-10-CM | POA: Insufficient documentation

## 2018-05-29 NOTE — Progress Notes (Signed)
Here for follow up " I really feel good overall -occasional tired episodes " per pt.  Repeat BP- 158/90 -pt stated she will follow up w pcp. Mild-very mild headache per pt- she related due to lack of sleep due to shoulder pain. In NAD . Marita [place] nurse navigator informed

## 2018-05-29 NOTE — Progress Notes (Signed)
Gynecologic Oncology Interval Visit   Referring Provider: Dr. Vikki Ports Ward  Chief Complaint: Endometrial Stromal Sarcoma  Subjective:  Karla Vance is a 48 y.o. 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  No complaints today. Incisions healing well. No hot flashes.   Patient was seen by Dr. Theora Gianotti in clinic on 04/24/2018.  Options for management were discussed at that time including plan for surgery and removal of ovaries.  Postoperative adjuvant therapy would be based on pathology.  We discussed that pathology would need to complete cytogenetic analysis to evaluate for chromosomal translocations as we correctly distinguish between low-grade ESS and high-grade ESS.  On 05/01/2018 she underwent laparoscopic BSO, trachelectomy, sentinel lymph node injection, mapping, and sampling, partial omentectomy with Dr. Leonides Schanz and Dr. Fransisca Connors at [facility].  DIAGNOSIS:  A. OMENTUM; PARTIAL OMENTECTOMY:  - NEGATIVE FOR MALIGNANCY.  - PATCHY CHRONIC INFLAMMATION AND REACTIVE MESOTHELIAL CHANGES.   B. OVARIES, BILATERAL; OOPHORECTOMY:  - NEGATIVE FOR MALIGNANCY.  - FOLLICULAR CYST, 2.0 CM, UNILATERAL.  - CORTICAL INCLUSION CYSTS, BILATERAL.   C. SENTINEL LYMPH NODE, LEFT EXTERNAL ILIAC; EXCISION:  - NEGATIVE FOR MALIGNANCY, ONE LYMPH NODE (0/1).  - REACTIVE FOLLICULAR LYMPHOID HYPERPLASIA.   D. SENTINEL LYMPH NODE, RIGHT EXTERNAL ILIAC; EXCISION:  - NEGATIVE FOR MALIGNANCY, ONE LYMPH NODE (0/1).  - REACTIVE FOLLICULAR LYMPHOID HYPERPLASIA.   E. TISSUE FROM ANTERIOR BLADDER WALL; EXCISION:  - NEGATIVE FOR MALIGNANCY.  - MESOTHELIAL-LINED FIBROADIPOSE TISSUE; NO MUSCULARIS PROPRIA OR  UROTHELIUM IS PRESENT.   F. CERVIX; TRACHELECTOMY:  - NEGATIVE FOR MALIGNANCY.  - NECROSIS AND FOREIGN BODY REACTION DUE TO PREVIOUS HYSTERECTOMY.   DIAGNOSIS:  A. PELVIC WASHINGS:  - NEGATIVE FOR MALIGNANCY.  - NEUTROPHILS,  CLUMPED MACROPHAGES, AND REACTIVE MESOTHELIAL CELLS.   Case was discussed at gynecologic oncology case conference on 05/01/2018.  Recommendation was to send out pathology for cytogenetic analysis at [facility].   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   Today, she reports overall feeling fatigued but otherwise at baseline.  Gynecologic Oncology History:  She was seen by Dr. Leonides Schanz for surgical management of symptomatic large fibroid uterus. On 04/05/18 she underwent a laparoscopic supracervical hysterectomy with power and hand morecellation and laparoscopic bilateral salpingectomy.  Uterus was noted to be significantly enlarged and thought to be due to fibroids, but the final report did not show fibroids.  Low grade ESS was present in 17 of 27 blocks.   Pathology:  A. UTERUS WITHOUT CERVIX, AND WITH BILATERAL FALLOPIAN TUBES;  SUPRACERVICAL HYSTERECTOMY WITH BILATERAL SALPINGECTOMY:  - LOW-GRADE ENDOMETRIAL STROMAL SARCOMA, MEASURING AT LEAST 3.0 CM IN LARGEST SAMPLED SECTION, PRESENT IN 17 OF 27 TISSUE BLOCKS. - ANGIOLYMPHATIC INVASION PRESENT.  - MARGIN STATUS AND STAGING CANNOT BE RELIABLY EVALUATED DUE TO MORCELLATION OF SPECIMEN.  - BILATERAL FALLOPIAN TUBES WITH NO SIGNIFICANT HISTOPATHOLOGIC CHANGE.   B. ABNORMAL UTERINE TISSUE; CURETTAGE:  - LOW-GRADE ENDOMETRIAL STROMAL SARCOMA, PRESENT IN 5 OF 5 TISSUE BLOCKS.   04/24/2018- CT Abdomen Pelvis W Contrast 1. No definite findings to suggest metastatic disease in the abdomen or pelvis. Lymph nodes in the right pelvis are upper normal for size and close attention on follow-up recommended. 2. Small volume free fluid in the cul-de-sac, nonspecific.   Problem List: Patient Active Problem List   Diagnosis Date Noted  .  Endometrial stromal  sarcoma ([place]) 04/24/2018  . Fibroid uterus 04/05/2018  . Menorrhagia 04/05/2018    Past Medical History: Past Medical History:  Diagnosis Date  . Anemia   . Arthritis   . Cancer [facility])    uterine cancer, sarcoma   . Collagen vascular disease (HCC)    RA  . GERD (gastroesophageal reflux disease)   . Hypothyroidism   . Pneumonia    as a child  . Thyroid disease   . Vitamin D deficiency     Past Surgical History: Past Surgical History:  Procedure Laterality Date  . LAPAROSCOPIC BILATERAL SALPINGECTOMY Bilateral 04/05/2018   Procedure: LAPAROSCOPIC BILATERAL SALPINGECTOMY;  Surgeon: Ward, Honor Loh, MD;  Location: [facility] ORS;  Service: Gynecology;  Laterality: Bilateral;  . LAPAROSCOPIC SUPRACERVICAL HYSTERECTOMY N/A 04/05/2018   Procedure: LAPAROSCOPIC SUPRACERVICAL HYSTERECTOMY, with powe morcellation;  Surgeon: Ward, Honor Loh, MD;  Location: [facility] ORS;  Service: Gynecology;  Laterality: N/A;  . NASAL SEPTUM SURGERY    . OMENTECTOMY  05/01/2018   Procedure: OMENTECTOMY, PARTIAL;  Surgeon: Ward, Honor Loh, MD;  Location: [facility] ORS;  Service: Gynecology;;  . TONSILLECTOMY    . TRACHELECTOMY N/A 05/01/2018   Procedure: TRACHELECTOMY, VAGINAL;  Surgeon: Ward, Honor Loh, MD;  Location: [facility] ORS;  Service: Gynecology;  Laterality: N/A;    Past Gynecologic History:  No abnormal Paps Menarche 1 Sexually active  OB History:  OB History  Gravida Para Term Preterm AB Living  6 6          SAB TAB Ectopic Multiple Live Births               # Outcome Date GA Lbr Len/2nd Weight Sex Delivery Anes PTL Lv  6 Para           5 Para           4 Para           3 Para           2 Para           1 Para             Obstetric Comments  NSVD x 6    Family History: Family History  Problem Relation Age of Onset  . Non-Hodgkin's lymphoma Mother   . Heart disease Father   . CAD Father 46       CABG x 3 vessels    Social History: Social History   Socioeconomic History  . Marital  status: Married    Spouse name: Not on file  . Number of children: Not on file  . Years of education: Not on file  . Highest education level: Not on file  Occupational History  . Not on file  Social Needs  . Financial resource strain: Not on file  . Food insecurity:    Worry: Not on file    Inability: Not on file  . Transportation needs:    Medical: Not on file    Non-medical: Not on file  Tobacco Use  . Smoking status: Never Smoker  . Smokeless tobacco: Never Used  Substance and Sexual Activity  . Alcohol use: Yes    Alcohol/week: 2.0 standard drinks    Types: 2 Glasses of wine per week  . Drug use: Never  . Sexual activity: Yes  Lifestyle  . Physical activity:    Days per week: Not on file    Minutes per session: Not on file  . Stress: Not on file  Relationships  . Social connections:    Talks on phone: Not on file    Gets together: Not on file    Attends religious service: Not on file    Active member of club or organization: Not on file    Attends meetings of clubs or organizations: Not on file    Relationship status: Not on file  . Intimate partner violence:    Fear of current or ex partner: Not on file    Emotionally abused: Not on file    Physically abused: Not on file    Forced sexual activity: Not on file  Other Topics Concern  . Not on file  Social History Narrative  . Not on file    Allergies: No Known Allergies  Current Medications: Current Outpatient Medications  Medication Sig Dispense Refill  . Cholecalciferol (VITAMIN D3) 25 MCG (1000 UT) CAPS Take 1,000 Units by mouth 2 (two) times daily.    . ferrous sulfate 325 (65 FE) MG tablet Take 325 mg by mouth daily at 2 PM.     . folic acid (FOLVITE) 1 MG tablet Take 1 mg by mouth daily at 2 PM.     . methotrexate (RHEUMATREX) 2.5 MG tablet Take 20 mg by mouth every Wednesday. Caution:Chemotherapy. Protect from light.    . thyroid (ARMOUR THYROID) 90 MG tablet Take 90 mg by mouth daily.     Marland Kitchen  acetaminophen (TYLENOL) 500 MG tablet Take 500 mg by mouth daily as needed for moderate pain or headache.    . ibuprofen (ADVIL,MOTRIN) 800 MG tablet Take 1 tablet (800 mg total) by mouth every 6 (six) hours. (Patient not taking: Reported on 05/29/2018) 45 tablet 1  . LORazepam (ATIVAN) 0.5 MG tablet Take by mouth.     . ondansetron (ZOFRAN ODT) 4 MG disintegrating tablet Take 1 tablet (4 mg total) by mouth every 8 (eight) hours as needed for nausea or vomiting. (Patient not taking: Reported on 05/29/2018) 20 tablet 0  . oxyCODONE (ROXICODONE) 5 MG immediate release tablet Take 1 tablet (5 mg total) by mouth every 4 (four) hours as needed. (Patient not taking: Reported on 05/29/2018) 21 tablet 0   No current facility-administered medications for this visit.     Review of Systems General:  fatigue Skin: no complaints Eyes: no complaints HEENT: no complaints Breasts: no complaints Pulmonary: no complaints Cardiac: no complaints Gastrointestinal: no complaints Genitourinary/Sexual: no complaints Ob/Gyn: no complaints Musculoskeletal: no complaints Hematology: no complaints Neurologic/Psych: no complaints   Objective:  Physical Examination:  BP (!) 149/88 (BP Location: Left Arm, Patient Position: Sitting)   Pulse 76   Temp (!) 96.7 F (35.9 C) (Tympanic)   Resp 18   Wt 172 lb (78 kg)   BMI 28.62 kg/m     ECOG Performance Status: 0 - Asymptomatic  GENERAL: Patient is a well appearing female in no acute distress HEENT:  Sclera clear. Anicteric NODES:  Negative axillary, supraclavicular, inguinal lymph node survery LUNGS:  Clear to auscultation bilaterally.   HEART:  Regular rate and rhythm.  ABDOMEN:  Soft, nontender.  No hernias, incisions well healed. No masses or ascites EXTREMITIES:  No peripheral edema. Atraumatic. No cyanosis SKIN:  Clear with no obvious rashes or skin changes.  NEURO:  Nonfocal. Well oriented.  Appropriate affect.  Pelvic: EGBUS: no lesions Vagina:  cuff healing well. Adnexa: no palpable masses    Assessment:  Karla Vance is a 48 y.o. 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  Post op. CT scan reassuring with no definite findings to suggest metastatic disease, but slightly enlarged pelvic lymph nodes.  Underwent completion surgery 05/01/18 with negative ovaries, cervix, omentum and pelvic SLNs and negative washings.  Normal post op exam today.  Medical co-morbidities complicating care: Recent surgery  Plan:   Problem List Items Addressed This Visit      Genitourinary   Endometrial stromal sarcoma ([place]) - Primary   Relevant Medications   LORazepam (ATIVAN) 0.5 MG tablet     We discussed options for management including watchful waiting versus adjuvant hormonal therapy with Megace.  There is no evidence that adjuvant hormonal therapy reduces recurrence risk in this disease, but I am concerned that she may be at increased risk due to morcellation of a large uterus that was extensively involved with low grade endometrial stromal sarcoma.  Intent of adjuvant Megace 40 mg bid would be to reduce the risk of recurrence, but this is of unproven benefit and treatment may be associated with adverse events such as weight gain and VTE.  We discussed that if she did not take Megace now, this would likely be the treatment if she had a recurrence.   She wants to discuss adjuvant therapy with her husband and will let us know if she wants to proceed.  Will see her back in 6 months with CT scan abd/pelvis for surveillance in view of morcellation, regardless of whether or not she decidees to take Megace.   Beckey Rutter, DNP, AGNP-C [place] at [facility] [phone] (work cell) [phone] (office)  I personally interviewed and examined the patient. Agreed with the above/below plan of care.  Patient/family questions were answered.  Mellody Drown, MD   CC:  Referring Provider: Dr. Vikki Ports Ward

## 2018-05-29 NOTE — Patient Instructions (Signed)
Megestrol tablets What is this medicine? MEGESTROL (me JES trol) belongs to a class of drugs known as progestins. Megestrol tablets are used to treat advanced breast or endometrial cancer. This medicine may be used for other purposes; ask your health care provider or pharmacist if you have questions. COMMON BRAND NAME(S): Megace What should I tell my health care provider before I take this medicine? They need to know if you have any of these conditions: -adrenal gland problems -history of blood clots of the legs, lungs, or other parts of the body -diabetes -kidney disease -liver disease -stroke -an unusual or allergic reaction to megestrol, other medicines, foods, dyes, or preservatives -pregnant or trying to get pregnant -breast-feeding How should I use this medicine? Take this medicine by mouth. Follow the directions on the prescription label. Do not take your medicine more often than directed. Take your doses at regular intervals. Do not stop taking except on the advice of your doctor or health care professional. Talk to your pediatrician regarding the use of this medicine in children. Special care may be needed. Overdosage: If you think you have taken too much of this medicine contact a poison control center or emergency room at once. NOTE: This medicine is only for you. Do not share this medicine with others. What if I miss a dose? If you miss a dose, take it as soon as you can. If it is almost time for your next dose, take only that dose. Do not take double or extra doses. What may interact with this medicine? Do not take this medicine with any of the following medications: -dofetilide This medicine may also interact with the following medications: -indinavir This list may not describe all possible interactions. Give your health care provider a list of all the medicines, herbs, non-prescription drugs, or dietary supplements you use. Also tell them if you smoke, drink alcohol, or use  illegal drugs. Some items may interact with your medicine. What should I watch for while using this medicine? Visit your doctor or health care professional for regular checks on your progress. Continue taking this medicine even if you feel better. It may take 2 months of regular use before you know if this medicine is working for your condition. This medicine can cause birth defects. Do not get pregnant while taking this drug. Females with child-bearing potential will need to have a negative pregnancy test before starting this medicine. Use an effective method of birth control while you are taking this medicine. If you think that you might be pregnant talk to your doctor right away. If you have diabetes, this medicine may affect blood sugar levels. Check your blood sugar and talk to your doctor or health care professional if you notice changes. What side effects may I notice from receiving this medicine? Side effects that you should report to your doctor or health care professional as soon as possible: Side effects that you should report to your doctor or health care professional as soon as possible: -allergic reactions like skin rash, itching or hives, swelling of the face, lips, or tongue -breathing problems -dizziness -increased blood pressure -signs and symptoms of a blood clot such as breathing problems; changes in vision; chest pain; severe, sudden headache; pain, swelling, warmth in the leg; trouble speaking; sudden numbness or weakness of the face, arm or leg -swelling of the ankles, feet, hands -unusually weak or tired -vomiting Side effects that usually do not require medical attention (report to your doctor or health care professional if  they continue or are bothersome): -breakthrough menstrual bleeding -changes in sex drive or performance -diarrhea -gas -hot flashes or flushing -increased appetite -upset stomach -weight gain This list may not describe all possible side effects.  Call your doctor for medical advice about side effects. You may report side effects to FDA at 1-800-FDA-1088. Where should I keep my medicine? Keep out of the reach of children. Store at controlled room temperature between 15 and 30 degrees C (59 and 86 degrees F). Protect from heat above 40 degrees C (104 degrees F). Throw away any unused medicine after the expiration date. NOTE: This sheet is a summary. It may not cover all possible information. If you have questions about this medicine, talk to your doctor, pharmacist, or health care provider.  2019 Elsevier/Gold Standard (2017-04-04 10:07:34)

## 2018-05-29 NOTE — Progress Notes (Signed)
Provided further information on Megace. Provided her with my contact information information for any future questions or needs. CT scan scheduled for August. She will be seen in clinic for follow up at that time (appx 11-13-18).

## 2018-06-05 ENCOUNTER — Ambulatory Visit: Payer: BLUE CROSS/BLUE SHIELD

## 2018-09-23 ENCOUNTER — Ambulatory Visit
Admission: RE | Admit: 2018-09-23 | Discharge: 2018-09-23 | Disposition: A | Discharge status: Home / Self Care | Payer: BC Managed Care – PPO | Source: Ambulatory Visit | Attending: Nurse Practitioner | Admitting: Nurse Practitioner

## 2018-09-23 ENCOUNTER — Other Ambulatory Visit: Payer: Self-pay

## 2018-09-23 DIAGNOSIS — Z1231 Encounter for screening mammogram for malignant neoplasm of breast: Secondary | ICD-10-CM | POA: Diagnosis not present

## 2018-11-11 ENCOUNTER — Ambulatory Visit
Admission: RE | Admit: 2018-11-11 | Discharge: 2018-11-11 | Disposition: A | Discharge status: Home / Self Care | Payer: BC Managed Care – PPO | Source: Ambulatory Visit | Attending: Nurse Practitioner | Admitting: Nurse Practitioner

## 2018-11-11 ENCOUNTER — Other Ambulatory Visit: Payer: Self-pay

## 2018-11-11 DIAGNOSIS — C541 Malignant neoplasm of endometrium: Secondary | ICD-10-CM | POA: Insufficient documentation

## 2018-11-11 MED ORDER — IOHEXOL 300 MG/ML  SOLN
100.0000 mL | Freq: Once | INTRAMUSCULAR | Status: AC | PRN
  Administered 2018-11-11: 09:00:00 100 mL via INTRAVENOUS

## 2018-11-13 ENCOUNTER — Ambulatory Visit: Payer: BLUE CROSS/BLUE SHIELD

## 2018-11-13 ENCOUNTER — Inpatient Hospital Stay: Payer: BC Managed Care – PPO | Attending: Obstetrics and Gynecology | Admitting: Obstetrics and Gynecology

## 2018-11-13 ENCOUNTER — Other Ambulatory Visit: Payer: Self-pay

## 2018-11-13 VITALS — BP 112/78 | HR 75 | Temp 97.0°F | Ht 66.0 in | Wt 163.9 lb

## 2018-11-13 DIAGNOSIS — Z90710 Acquired absence of both cervix and uterus: Secondary | ICD-10-CM | POA: Diagnosis not present

## 2018-11-13 DIAGNOSIS — Z90722 Acquired absence of ovaries, bilateral: Secondary | ICD-10-CM | POA: Insufficient documentation

## 2018-11-13 DIAGNOSIS — Z8542 Personal history of malignant neoplasm of other parts of uterus: Secondary | ICD-10-CM | POA: Insufficient documentation

## 2018-11-13 DIAGNOSIS — C541 Malignant neoplasm of endometrium: Secondary | ICD-10-CM

## 2018-11-13 NOTE — Progress Notes (Signed)
Gynecologic Oncology Interval Visit   Referring Provider: Dr. Vikki Ports Ward  Chief Complaint: Endometrial Stromal Sarcoma  Subjective:  Karla Vance is a 48 y.o. 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   She was seen by Dr. Fransisca Connors in February and was NED at that time.  She saw Dr. Leonides Schanz in late March. A suspicious lesion at the vaginal cuff was present and was biopsied: VAGINA, CUFF, BIOPSY: UNREMARKABLE SQUAMOUS MUCOSA. NEGATIVE FOR DYSPLASIA, VIRAL EFFECT, AND MALIGNANCY.   She saw Dr. Leonides Schanz on 10/10/2018 and was NED at that time.  11/11/2018- CT abdomen/pelvis IMPRESSION: 1. Status post hysterectomy. No evidence of metastatic disease in the abdomen or pelvis. 2. Previously seen prominent right obturator and iliac lymph nodes are resolved. These were likely reactive lymph nodes in the postoperative setting.   She feels well. Some intermittent hot flashes at night that aren't bothersome.   Gynecologic Oncology History:  She was seen by Dr. Leonides Schanz for surgical management of symptomatic large fibroid uterus. On 04/05/18 she underwent a laparoscopic supracervical hysterectomy with power and hand morecellation and laparoscopic bilateral salpingectomy.  Uterus was noted to be significantly enlarged and thought to be due to fibroids, but the final report did not show fibroids.  Low grade ESS was present in 17 of 27 blocks. Morcellation was performed in bag per her note.   Pathology:  A. UTERUS WITHOUT CERVIX, AND WITH BILATERAL FALLOPIAN TUBES; SUPRACERVICAL HYSTERECTOMY WITH BILATERAL SALPINGECTOMY:  - LOW-GRADE ENDOMETRIAL STROMAL SARCOMA, MEASURING AT LEAST 3.0 CM IN LARGEST SAMPLED  SECTION, PRESENT IN 17 OF 27 TISSUE BLOCKS. - ANGIOLYMPHATIC INVASION PRESENT.  - MARGIN STATUS AND STAGING CANNOT BE RELIABLY EVALUATED DUE TO MORCELLATION OF SPECIMEN.  - BILATERAL FALLOPIAN TUBES WITH NO SIGNIFICANT HISTOPATHOLOGIC CHANGE.   B. ABNORMAL UTERINE TISSUE; CURETTAGE:  - LOW-GRADE ENDOMETRIAL STROMAL SARCOMA, PRESENT IN 5 OF 5 TISSUE BLOCKS.   04/24/2018- CT Abdomen Pelvis W Contrast 1. No definite findings to suggest metastatic disease in the abdomen or pelvis. Lymph nodes in the right pelvis are upper normal for size and close attention on follow-up recommended. 2. Small volume free fluid in the cul-de-sac, nonspecific.  Patient was seen by Dr. Theora Gianotti in clinic on 04/24/2018.  Options for management were discussed at that time including plan for surgery and removal of ovaries.  Postoperative adjuvant therapy would be based on pathology.  We discussed that pathology would need to complete cytogenetic analysis to evaluate for chromosomal translocations as we correctly distinguish between low-grade ESS and high-grade ESS.  On 05/01/2018 she underwent laparoscopic BSO, trachelectomy, sentinel lymph node injection, mapping, and sampling, partial omentectomy with Dr. Leonides Schanz and Dr. Fransisca Connors at [facility].  DIAGNOSIS:  A. OMENTUM; PARTIAL OMENTECTOMY:  - NEGATIVE FOR MALIGNANCY.  - PATCHY CHRONIC INFLAMMATION AND REACTIVE MESOTHELIAL CHANGES.   B. OVARIES, BILATERAL; OOPHORECTOMY:  - NEGATIVE FOR MALIGNANCY.  - FOLLICULAR CYST, 2.0 CM, UNILATERAL.  - CORTICAL INCLUSION CYSTS, BILATERAL.   C. SENTINEL LYMPH NODE, LEFT EXTERNAL ILIAC; EXCISION:  - NEGATIVE FOR MALIGNANCY, ONE LYMPH NODE (0/1).  - REACTIVE FOLLICULAR LYMPHOID HYPERPLASIA.   D. SENTINEL LYMPH NODE, RIGHT EXTERNAL ILIAC; EXCISION:  - NEGATIVE FOR MALIGNANCY, ONE LYMPH NODE (0/1).  - REACTIVE FOLLICULAR LYMPHOID HYPERPLASIA.   E. TISSUE FROM ANTERIOR  BLADDER WALL; EXCISION:  - NEGATIVE FOR MALIGNANCY.  -  MESOTHELIAL-LINED FIBROADIPOSE TISSUE; NO MUSCULARIS PROPRIA OR  UROTHELIUM IS PRESENT.   F. CERVIX; TRACHELECTOMY:  - NEGATIVE FOR MALIGNANCY.  - NECROSIS AND FOREIGN BODY REACTION DUE TO PREVIOUS HYSTERECTOMY.   DIAGNOSIS:  A. PELVIC WASHINGS:  - NEGATIVE FOR MALIGNANCY.  - NEUTROPHILS, CLUMPED MACROPHAGES, AND REACTIVE MESOTHELIAL CELLS.   Case was discussed at gynecologic oncology case conference on 05/01/2018.  Recommendation was to send out pathology for cytogenetic analysis at [facility].   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   Problem List: Patient Active Problem List   Diagnosis Date Noted  . Endometrial stromal sarcoma ([place]) 04/24/2018  . Fibroid uterus 04/05/2018  . Menorrhagia 04/05/2018    Past Medical History: Past Medical History:  Diagnosis Date  . Anemia   . Arthritis   . Cancer [facility])    uterine cancer, sarcoma   . Collagen vascular disease (HCC)    RA  . GERD (gastroesophageal reflux disease)   . Hypothyroidism   . Pneumonia    as a child  . Thyroid disease   . Vitamin D deficiency     Past Surgical History: Past Surgical History:  Procedure Laterality Date  . ABDOMINAL HYSTERECTOMY    . LAPAROSCOPIC BILATERAL SALPINGECTOMY Bilateral 04/05/2018   Procedure: LAPAROSCOPIC BILATERAL SALPINGECTOMY;  Surgeon: Ward, Honor Loh, MD;  Location: [facility] ORS;  Service: Gynecology;  Laterality: Bilateral;  . LAPAROSCOPIC SUPRACERVICAL HYSTERECTOMY N/A 04/05/2018   Procedure: LAPAROSCOPIC SUPRACERVICAL HYSTERECTOMY, with powe morcellation;  Surgeon: Ward, Honor Loh, MD;  Location: [facility] ORS;  Service: Gynecology;  Laterality: N/A;  . NASAL SEPTUM SURGERY    . OMENTECTOMY  05/01/2018   Procedure: OMENTECTOMY, PARTIAL;  Surgeon: Ward, Honor Loh, MD;  Location: [facility]  ORS;  Service: Gynecology;;  . Morene Crocker    . TONSILLECTOMY    . TRACHELECTOMY N/A 05/01/2018   Procedure: TRACHELECTOMY, VAGINAL;  Surgeon: Ward, Honor Loh, MD;  Location: [facility] ORS;  Service: Gynecology;  Laterality: N/A;    Past Gynecologic History:  No abnormal Paps Menarche 30 Sexually active  OB History:  OB History  Gravida Para Term Preterm AB Living  6 6          SAB TAB Ectopic Multiple Live Births               # Outcome Date GA Lbr Len/2nd Weight Sex Delivery Anes PTL Lv  6 Para           5 Para           4 Para           3 Para           2 Para           1 Para             Obstetric Comments  NSVD x 6    Family History: Family History  Problem Relation Age of Onset  . Non-Hodgkin's lymphoma Mother   . Heart disease Father   . CAD Father 68       CABG x 3 vessels  . Breast cancer Neg Hx     Social History: Social History   Socioeconomic History  . Marital status: Married    Spouse name: Not on file  . Number of children: Not on  file  . Years of education: Not on file  . Highest education level: Not on file  Occupational History  . Not on file  Social Needs  . Financial resource strain: Not on file  . Food insecurity    Worry: Not on file    Inability: Not on file  . Transportation needs    Medical: Not on file    Non-medical: Not on file  Tobacco Use  . Smoking status: Never Smoker  . Smokeless tobacco: Never Used  Substance and Sexual Activity  . Alcohol use: Yes    Alcohol/week: 2.0 standard drinks    Types: 2 Glasses of wine per week  . Drug use: Never  . Sexual activity: Yes  Lifestyle  . Physical activity    Days per week: Not on file    Minutes per session: Not on file  . Stress: Not on file  Relationships  . Social Herbalist on phone: Not on file    Gets together: Not on file    Attends religious service: Not on file    Active member of club or organization: Not on file    Attends meetings of clubs or  organizations: Not on file    Relationship status: Not on file  . Intimate partner violence    Fear of current or ex partner: Not on file    Emotionally abused: Not on file    Physically abused: Not on file    Forced sexual activity: Not on file  Other Topics Concern  . Not on file  Social History Narrative  . Not on file    Allergies: No Known Allergies  Current Medications: Current Outpatient Medications  Medication Sig Dispense Refill  . Cholecalciferol (VITAMIN D3) 25 MCG (1000 UT) CAPS Take 1,000 Units by mouth 2 (two) times daily.    . ferrous sulfate 325 (65 FE) MG tablet Take 325 mg by mouth daily at 2 PM.     . folic acid (FOLVITE) 1 MG tablet Take 1 mg by mouth daily at 2 PM.     . methotrexate (RHEUMATREX) 2.5 MG tablet Take 20 mg by mouth every Wednesday. Caution:Chemotherapy. Protect from light.    . thyroid (ARMOUR THYROID) 90 MG tablet Take 90 mg by mouth daily.     Marland Kitchen acetaminophen (TYLENOL) 500 MG tablet Take 500 mg by mouth daily as needed for moderate pain or headache.    . ibuprofen (ADVIL,MOTRIN) 800 MG tablet Take 1 tablet (800 mg total) by mouth every 6 (six) hours. (Patient not taking: Reported on 05/29/2018) 45 tablet 1  . ondansetron (ZOFRAN ODT) 4 MG disintegrating tablet Take 1 tablet (4 mg total) by mouth every 8 (eight) hours as needed for nausea or vomiting. (Patient not taking: Reported on 05/29/2018) 20 tablet 0  . oxyCODONE (ROXICODONE) 5 MG immediate release tablet Take 1 tablet (5 mg total) by mouth every 4 (four) hours as needed. (Patient not taking: Reported on 05/29/2018) 21 tablet 0   No current facility-administered medications for this visit.    Review of Systems General:  no complaints Skin: no complaints Eyes: no complaints HEENT: no complaints Breasts: no complaints Pulmonary: no complaints Cardiac: no complaints Gastrointestinal: no complaints Genitourinary/Sexual: no complaints Ob/Gyn: no complaints Musculoskeletal: no  complaints Hematology: no complaints Neurologic/Psych: no complaints  Objective:  Physical Examination:  Today's Vitals   11/13/18 0819 11/13/18 0822  BP:  112/78  Pulse:  75  Temp: (!) 97 F (36.1 C) (!)  97 F (36.1 C)  TempSrc: Tympanic   Weight: 163 lb 14.4 oz (74.3 kg) 163 lb 14.4 oz (74.3 kg)  Height: 5\' 6"  (1.676 m)   PainSc: 0-No pain 0-No pain   Body mass index is 26.45 kg/m. BP 112/78 (BP Location: Left Arm, Patient Position: Sitting)   Pulse 75   Temp (!) 97 F (36.1 C)   Ht 5\' 6"  (1.676 m)   Wt 163 lb 14.4 oz (74.3 kg)   LMP 03/23/2018   BMI 26.45 kg/m     ECOG Performance Status: 0 - Asymptomatic  GENERAL: Patient is a well appearing female in no acute distress HEENT:  Sclera clear. Anicteric NODES:  Negative axillary, supraclavicular, inguinal lymph node survery LUNGS:  Clear to auscultation bilaterally.   HEART:  Regular rate and rhythm.  ABDOMEN:  Soft, nontender.  No hernias, incisions well healed. No masses or ascites EXTREMITIES:  No peripheral edema. Atraumatic. No cyanosis SKIN:  Clear with no obvious rashes or skin changes.  NEURO:  Nonfocal. Well oriented.  Appropriate affect.  Pelvic: EGBUS: no lesions Vagina: cuff healing well. There are a few flat scattered mildly erythematous lesions. Not concerning for malignancy.  Adnexa: no palpable masses Rectal:  deferred    Assessment:  Karla Vance is a 48 y.o. 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   Medical co-morbidities complicating care:  Plan:   Problem List Items Addressed This Visit      Genitourinary   Endometrial stromal sarcoma ([place]) - Primary     CT every 6 months in view of morcellation Based on NCCN guidelines we  will plan to repeat  Every 6 months for first 3 years then yearly for next 2 years then after based on clinical concerns/symptoms  3-4 mo for first 3 then every 6 months until 5 years. Annually thereafter; but we can release from [facility] clinic as she has good follow up with Dr. Leonides Schanz.   Previously Dr. Fransisca Connors discussed options for management including watchful waiting versus adjuvant hormonal therapy with Megace. She did not opt to proceed with Megace and given the time interval we would not recommend starting now.   Beckey Rutter, DNP, AGNP-C [place] [facility] [phone] (work cell) [phone] (office)  I personally had a face to face interaction and evaluated the patient jointly with the NP, Ms. Beckey Rutter.  I have reviewed her history and available records and have performed the key portions of the physical exam including general, HEENT, abdominal exam, pelvic exam with my findings confirming those documented above by the APP.  I have discussed the case with the APP and the patient.  I agree with the above documentation, assessment and plan which was fully formulated by me.  Counseling was completed by me.   I personally saw the patient and performed a substantive portion of this encounter in conjunction with the listed APP as documented above.  A total of 25 minutes were spent with the patient/family today; >50% was spent in education, counseling and coordination of care for Endometrial stromal sarcoma.   Angeles Gaetana Michaelis, MD    CC:  Referring Provider: Dr. Vikki Ports Ward

## 2019-04-15 NOTE — Progress Notes (Signed)
Called patient no answer left message  

## 2019-04-16 ENCOUNTER — Other Ambulatory Visit: Payer: Self-pay

## 2019-04-16 ENCOUNTER — Inpatient Hospital Stay: Payer: 59 | Attending: Obstetrics and Gynecology | Admitting: Obstetrics and Gynecology

## 2019-04-16 DIAGNOSIS — C541 Malignant neoplasm of endometrium: Secondary | ICD-10-CM

## 2019-04-16 NOTE — Progress Notes (Signed)
Gynecologic Oncology Interval Visit   Referring Provider: Dr. Vikki Ports Ward  Chief Complaint: Endometrial Stromal Sarcoma  Subjective:  Karla Vance is a 49 y.o. 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   She saw Dr. Leonides Schanz on 04/15/2019 and had a negative exam. She is scheduled for a CT scan in 05/2019.     Gynecologic Oncology History:  She was seen by Dr. Leonides Schanz for surgical management of symptomatic large fibroid uterus. On 04/05/18 she underwent a laparoscopic supracervical hysterectomy with power and hand morecellation and laparoscopic bilateral salpingectomy.  Uterus was noted to be significantly enlarged and thought to be due to fibroids, but the final report did not show fibroids.  Low grade ESS was present in 17 of 27 blocks. Morcellation was performed in bag per her note.   Pathology:  A. UTERUS WITHOUT CERVIX, AND WITH BILATERAL FALLOPIAN TUBES; SUPRACERVICAL HYSTERECTOMY WITH BILATERAL SALPINGECTOMY:  - LOW-GRADE ENDOMETRIAL STROMAL SARCOMA, MEASURING AT LEAST 3.0 CM IN LARGEST SAMPLED SECTION, PRESENT IN 17 OF 27 TISSUE BLOCKS. - ANGIOLYMPHATIC INVASION PRESENT.  - MARGIN STATUS AND STAGING CANNOT BE RELIABLY EVALUATED DUE TO MORCELLATION OF SPECIMEN.  - BILATERAL FALLOPIAN TUBES WITH NO SIGNIFICANT HISTOPATHOLOGIC CHANGE.   B. ABNORMAL UTERINE TISSUE; CURETTAGE:  - LOW-GRADE ENDOMETRIAL STROMAL SARCOMA, PRESENT IN 5 OF 5 TISSUE BLOCKS.   04/24/2018- CT Abdomen Pelvis W Contrast 1. No definite findings to suggest metastatic disease in the abdomen or pelvis. Lymph nodes in the right pelvis are upper normal for size and close attention on follow-up recommended. 2. Small  volume free fluid in the cul-de-sac, nonspecific.  Patient was seen by Dr. Theora Gianotti in clinic on 04/24/2018.  Options for management were discussed at that time including plan for surgery and removal of ovaries.  Postoperative adjuvant therapy would be based on pathology.  We discussed that pathology would need to complete cytogenetic analysis to evaluate for chromosomal translocations as we correctly distinguish between low-grade ESS and high-grade ESS.  On 05/01/2018 she underwent laparoscopic BSO, trachelectomy, sentinel lymph node injection, mapping, and sampling, partial omentectomy with Dr. Leonides Schanz and Dr. Fransisca Connors at [facility].  DIAGNOSIS:  A. OMENTUM; PARTIAL OMENTECTOMY:  - NEGATIVE FOR MALIGNANCY.  - PATCHY CHRONIC INFLAMMATION AND REACTIVE MESOTHELIAL CHANGES.   B. OVARIES, BILATERAL; OOPHORECTOMY:  - NEGATIVE FOR MALIGNANCY.  - FOLLICULAR CYST, 2.0 CM, UNILATERAL.  - CORTICAL INCLUSION CYSTS, BILATERAL.   C. SENTINEL LYMPH NODE, LEFT EXTERNAL ILIAC; EXCISION:  - NEGATIVE FOR MALIGNANCY, ONE LYMPH NODE (0/1).  - REACTIVE FOLLICULAR LYMPHOID HYPERPLASIA.   D. SENTINEL LYMPH NODE, RIGHT EXTERNAL ILIAC; EXCISION:  - NEGATIVE FOR MALIGNANCY, ONE LYMPH NODE (0/1).  - REACTIVE FOLLICULAR LYMPHOID HYPERPLASIA.   E. TISSUE FROM ANTERIOR BLADDER WALL; EXCISION:  - NEGATIVE FOR MALIGNANCY.  - MESOTHELIAL-LINED FIBROADIPOSE TISSUE; NO MUSCULARIS PROPRIA OR  UROTHELIUM IS PRESENT.   F. CERVIX; TRACHELECTOMY:  - NEGATIVE FOR MALIGNANCY.  - NECROSIS AND FOREIGN BODY REACTION DUE TO PREVIOUS HYSTERECTOMY.   DIAGNOSIS:  A. PELVIC WASHINGS:  - NEGATIVE FOR MALIGNANCY.  - NEUTROPHILS, CLUMPED MACROPHAGES, AND REACTIVE MESOTHELIAL CELLS.   Case was discussed at gynecologic oncology case conference on 05/01/2018.  Recommendation was to send out pathology for cytogenetic analysis at [facility].   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   Previously Dr. Fransisca Connors discussed options for management including watchful waiting versus adjuvant hormonal therapy with Megace. She did not opt to proceed with Megace.   She is followed by Dr. Leonides Schanz and our team at [facility]. She has been NED.  06/2018 She saw Dr. Leonides Schanz in late March. A suspicious lesion at the vaginal cuff was present and was biopsied: VAGINA, CUFF, BIOPSY: UNREMARKABLE SQUAMOUS MUCOSA. NEGATIVE FOR DYSPLASIA, VIRAL EFFECT, AND MALIGNANCY.   11/11/2018- CT abdomen/pelvis IMPRESSION: 1. Status post hysterectomy. No evidence of metastatic disease in the abdomen or pelvis. 2. Previously seen prominent right obturator and iliac lymph nodes are resolved. These were likely reactive lymph nodes in the postoperative setting.     Problem List: Patient Active Problem List   Diagnosis Date Noted  . Endometrial stromal sarcoma ([place]) 04/24/2018  . Fibroid uterus 04/05/2018  . Menorrhagia 04/05/2018    Past Medical History: Past Medical History:  Diagnosis Date  . Anemia   . Arthritis   . Cancer [facility])    uterine cancer, sarcoma   . Collagen vascular disease (HCC)    RA  . GERD (gastroesophageal reflux disease)   . Hypothyroidism   . Pneumonia    as a child  . Thyroid disease   . Vitamin D deficiency     Past Surgical History: Past Surgical History:  Procedure Laterality Date  . ABDOMINAL HYSTERECTOMY    . LAPAROSCOPIC BILATERAL SALPINGECTOMY Bilateral 04/05/2018   Procedure: LAPAROSCOPIC BILATERAL SALPINGECTOMY;  Surgeon: Ward, Honor Loh, MD;  Location: [facility] ORS;  Service: Gynecology;  Laterality: Bilateral;  . LAPAROSCOPIC SUPRACERVICAL HYSTERECTOMY N/A 04/05/2018   Procedure: LAPAROSCOPIC SUPRACERVICAL HYSTERECTOMY, with powe morcellation;  Surgeon: Ward, Honor Loh, MD;  Location: [facility] ORS;  Service: Gynecology;   Laterality: N/A;  . NASAL SEPTUM SURGERY    . OMENTECTOMY  05/01/2018   Procedure: OMENTECTOMY, PARTIAL;  Surgeon: Ward, Honor Loh, MD;  Location: [facility] ORS;  Service: Gynecology;;  . Morene Crocker    . TONSILLECTOMY    . TRACHELECTOMY N/A 05/01/2018   Procedure: TRACHELECTOMY, VAGINAL;  Surgeon: Ward, Honor Loh, MD;  Location: [facility] ORS;  Service: Gynecology;  Laterality: N/A;    Past Gynecologic History:  No abnormal Paps Menarche 52 Sexually active  OB History:  OB History  Gravida Para Term Preterm AB Living  6 6          SAB TAB Ectopic Multiple Live Births               # Outcome Date GA Lbr Len/2nd Weight Sex Delivery Anes PTL Lv  6 Para           5 Para           4 Para           3 Para           2 Para           1 Para             Obstetric Comments  NSVD x 6    Family History: Family History  Problem Relation Age of Onset  . Non-Hodgkin's lymphoma Mother   . Heart disease Father   . CAD Father 33       CABG x 3 vessels  . Breast cancer Neg Hx     Social History:  Social History   Socioeconomic History  . Marital status: Married    Spouse name: Not on file  . Number of children: Not on file  . Years of education: Not on file  . Highest education level: Not on file  Occupational History  . Not on file  Tobacco Use  . Smoking status: Never Smoker  . Smokeless tobacco: Never Used  Substance and Sexual Activity  . Alcohol use: Yes    Alcohol/week: 2.0 standard drinks    Types: 2 Glasses of wine per week  . Drug use: Never  . Sexual activity: Yes  Other Topics Concern  . Not on file  Social History Narrative  . Not on file   Social Determinants of Health   Financial Resource Strain:   . Difficulty of Paying Living Expenses: Not on file  Food Insecurity:   . Worried About Charity fundraiser in the Last Year: Not on file  . Ran Out of Food in the Last Year: Not on file  Transportation Needs:   . Lack of Transportation (Medical): Not on file   . Lack of Transportation (Non-Medical): Not on file  Physical Activity:   . Days of Exercise per Week: Not on file  . Minutes of Exercise per Session: Not on file  Stress:   . Feeling of Stress : Not on file  Social Connections:   . Frequency of Communication with Friends and Family: Not on file  . Frequency of Social Gatherings with Friends and Family: Not on file  . Attends Religious Services: Not on file  . Active Member of Clubs or Organizations: Not on file  . Attends Archivist Meetings: Not on file  . Marital Status: Not on file  Intimate Partner Violence:   . Fear of Current or Ex-Partner: Not on file  . Emotionally Abused: Not on file  . Physically Abused: Not on file  . Sexually Abused: Not on file    Allergies: No Known Allergies  Current Medications: Current Outpatient Medications  Medication Sig Dispense Refill  . acetaminophen (TYLENOL) 500 MG tablet Take 500 mg by mouth daily as needed for moderate pain or headache.    . Cholecalciferol (VITAMIN D3) 25 MCG (1000 UT) CAPS Take 1,000 Units by mouth 2 (two) times daily.    . ferrous sulfate 325 (65 FE) MG tablet Take 325 mg by mouth daily at 2 PM.     . folic acid (FOLVITE) 1 MG tablet Take 1 mg by mouth daily at 2 PM.     . ibuprofen (ADVIL,MOTRIN) 800 MG tablet Take 1 tablet (800 mg total) by mouth every 6 (six) hours. (Patient not taking: Reported on 05/29/2018) 45 tablet 1  . methotrexate (RHEUMATREX) 2.5 MG tablet Take 20 mg by mouth every Wednesday. Caution:Chemotherapy. Protect from light.    . ondansetron (ZOFRAN ODT) 4 MG disintegrating tablet Take 1 tablet (4 mg total) by mouth every 8 (eight) hours as needed for nausea or vomiting. (Patient not taking: Reported on 05/29/2018) 20 tablet 0  . oxyCODONE (ROXICODONE) 5 MG immediate release tablet Take 1 tablet (5 mg total) by mouth every 4 (four) hours as needed. (Patient not taking: Reported on 05/29/2018) 21 tablet 0  . thyroid (ARMOUR THYROID) 90 MG  tablet Take 90 mg by mouth daily.      No current facility-administered medications for this visit.   ROS and exam deferred.     Assessment:  Karla Vance is a 49 y.o. 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   Medical co-morbidities complicating care:  Plan:   Problem List Items Addressed This Visit      Genitourinary   Endometrial stromal sarcoma ([place]) - Primary     Since she recently saw Dr. Leonides Schanz with a negative exam yesterday we will cancel today's visit.   Plan for CT every 6 months in view of morcellation. She is scheduled for 05/2019 and we will reschedule her appt after this CT scan.    Based on NCCN guidelines we will plan to repeat. We hope to alternate with Dr. Leonides Schanz. Every 6 months for first 3 years then yearly for next 2 years then after based on clinical concerns/symptoms  3-4 mo for first 3 then every 6 months until 5 years. Annually thereafter; but we can release from [facility] clinic as she has good follow up with Dr. Leonides Schanz.   Today's appointment canceled. I did not see Ms. Uram. Mariea Clonts, RN, spoke with her about the plan.    Raylie Maddison Gaetana Michaelis, MD    CC:  Referring Provider: Dr. Vikki Ports Ward

## 2019-05-22 ENCOUNTER — Other Ambulatory Visit: Payer: Self-pay

## 2019-05-22 ENCOUNTER — Ambulatory Visit
Admission: RE | Admit: 2019-05-22 | Discharge: 2019-05-22 | Disposition: A | Discharge status: Home / Self Care | Payer: 59 | Source: Ambulatory Visit | Attending: Nurse Practitioner | Admitting: Nurse Practitioner

## 2019-05-22 DIAGNOSIS — C541 Malignant neoplasm of endometrium: Secondary | ICD-10-CM | POA: Diagnosis present

## 2019-05-22 MED ORDER — IOHEXOL 300 MG/ML  SOLN
100.0000 mL | Freq: Once | INTRAMUSCULAR | Status: AC | PRN
  Administered 2019-05-22: 100 mL via INTRAVENOUS

## 2019-05-28 ENCOUNTER — Other Ambulatory Visit: Payer: Self-pay

## 2019-05-28 ENCOUNTER — Inpatient Hospital Stay: Payer: 59 | Attending: Obstetrics and Gynecology | Admitting: Obstetrics and Gynecology

## 2019-05-28 VITALS — BP 114/79 | HR 68 | Temp 96.7°F | Resp 18 | Wt 162.0 lb

## 2019-05-28 DIAGNOSIS — C541 Malignant neoplasm of endometrium: Secondary | ICD-10-CM | POA: Insufficient documentation

## 2019-05-28 DIAGNOSIS — Z9079 Acquired absence of other genital organ(s): Secondary | ICD-10-CM | POA: Diagnosis not present

## 2019-05-28 DIAGNOSIS — E039 Hypothyroidism, unspecified: Secondary | ICD-10-CM | POA: Insufficient documentation

## 2019-05-28 DIAGNOSIS — Z90710 Acquired absence of both cervix and uterus: Secondary | ICD-10-CM | POA: Diagnosis not present

## 2019-05-28 DIAGNOSIS — R590 Localized enlarged lymph nodes: Secondary | ICD-10-CM | POA: Insufficient documentation

## 2019-05-28 DIAGNOSIS — E079 Disorder of thyroid, unspecified: Secondary | ICD-10-CM | POA: Diagnosis not present

## 2019-05-28 DIAGNOSIS — K219 Gastro-esophageal reflux disease without esophagitis: Secondary | ICD-10-CM | POA: Insufficient documentation

## 2019-05-28 DIAGNOSIS — R599 Enlarged lymph nodes, unspecified: Secondary | ICD-10-CM

## 2019-05-28 DIAGNOSIS — E559 Vitamin D deficiency, unspecified: Secondary | ICD-10-CM | POA: Insufficient documentation

## 2019-05-28 DIAGNOSIS — R591 Generalized enlarged lymph nodes: Secondary | ICD-10-CM | POA: Diagnosis not present

## 2019-05-28 NOTE — Patient Instructions (Signed)
Please contact Dr. Guido Sander office to reschedule your appointment with her to May 2021. We will plan to see you back in August 2021.

## 2019-05-28 NOTE — Progress Notes (Addendum)
Gynecologic Oncology Interval Visit   Referring Provider: Dr. Vikki Ports Ward  Chief Complaint: Endometrial Stromal Sarcoma  Subjective:  Karla Vance is a 49 y.o. 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   She saw Dr. Leonides Schanz on 04/15/2019 and had a negative exam.   She had a CT scan on 05/22/19    1. New left external iliac lymphadenopathy, metastatic disease not excluded. Measures 1.7 cm short axis increased from 0.9 cm on 11/11/2018 CT. No other enlarged lymph nodes.  2. No additional potential sites of metastatic disease in the abdomen or pelvis. 3. Aortic Atherosclerosis (ICD10-I70.0).  Today she says that she feels well and denies complaints.   Gynecologic Oncology History:  She was seen by Dr. Leonides Schanz for surgical management of symptomatic large fibroid uterus. On 04/05/18 she underwent a laparoscopic supracervical hysterectomy with power and hand morecellation and laparoscopic bilateral salpingectomy.  Uterus was noted to be significantly enlarged and thought to be due to fibroids, but the final report did not show fibroids.  Low grade ESS was present in 17 of 27 blocks. Morcellation was performed in bag per her note.   Pathology:  A. UTERUS WITHOUT CERVIX, AND WITH BILATERAL FALLOPIAN TUBES; SUPRACERVICAL HYSTERECTOMY WITH BILATERAL SALPINGECTOMY:  - LOW-GRADE ENDOMETRIAL STROMAL SARCOMA, MEASURING AT LEAST 3.0 CM IN LARGEST SAMPLED SECTION, PRESENT IN 17 OF 27 TISSUE BLOCKS. - ANGIOLYMPHATIC INVASION PRESENT.  - MARGIN STATUS AND STAGING CANNOT BE RELIABLY EVALUATED DUE TO MORCELLATION OF SPECIMEN.  - BILATERAL FALLOPIAN TUBES WITH NO SIGNIFICANT HISTOPATHOLOGIC CHANGE.   B.  ABNORMAL UTERINE TISSUE; CURETTAGE:  - LOW-GRADE ENDOMETRIAL STROMAL SARCOMA, PRESENT IN 5 OF 5 TISSUE BLOCKS.   04/24/2018- CT Abdomen Pelvis W Contrast 1. No definite findings to suggest metastatic disease in the abdomen or pelvis. Lymph nodes in the right pelvis are upper normal for size and close attention on follow-up recommended. 2. Small volume free fluid in the cul-de-sac, nonspecific.  Patient was seen by Dr. Theora Gianotti in clinic on 04/24/2018.  Options for management were discussed at that time including plan for surgery and removal of ovaries.  Postoperative adjuvant therapy would be based on pathology.  We discussed that pathology would need to complete cytogenetic analysis to evaluate for chromosomal translocations as we correctly distinguish between low-grade ESS and high-grade ESS.  On 05/01/2018 she underwent laparoscopic BSO, trachelectomy, sentinel lymph node injection, mapping, and sampling, partial omentectomy with Dr. Leonides Schanz and Dr. Fransisca Connors at [facility].  DIAGNOSIS:  A. OMENTUM; PARTIAL OMENTECTOMY:  - NEGATIVE FOR MALIGNANCY.  - PATCHY CHRONIC INFLAMMATION AND REACTIVE MESOTHELIAL CHANGES.   B. OVARIES, BILATERAL; OOPHORECTOMY:  - NEGATIVE FOR MALIGNANCY.  - FOLLICULAR CYST, 2.0 CM, UNILATERAL.  - CORTICAL INCLUSION CYSTS, BILATERAL.   C. SENTINEL LYMPH NODE, LEFT EXTERNAL ILIAC; EXCISION:  - NEGATIVE FOR MALIGNANCY, ONE LYMPH NODE (0/1).  - REACTIVE FOLLICULAR LYMPHOID HYPERPLASIA.   D. SENTINEL LYMPH NODE, RIGHT EXTERNAL ILIAC; EXCISION:  - NEGATIVE FOR MALIGNANCY, ONE LYMPH NODE (0/1).  - REACTIVE FOLLICULAR LYMPHOID HYPERPLASIA.   E. TISSUE FROM ANTERIOR BLADDER WALL; EXCISION:  - NEGATIVE FOR MALIGNANCY.  - MESOTHELIAL-LINED FIBROADIPOSE TISSUE; NO MUSCULARIS PROPRIA OR  UROTHELIUM IS PRESENT.   F. CERVIX; TRACHELECTOMY:  - NEGATIVE FOR MALIGNANCY.  - NECROSIS AND FOREIGN BODY  REACTION DUE TO PREVIOUS HYSTERECTOMY.   DIAGNOSIS:  A. PELVIC WASHINGS:  - NEGATIVE  FOR MALIGNANCY.  - NEUTROPHILS, CLUMPED MACROPHAGES, AND REACTIVE MESOTHELIAL CELLS.   Case was discussed at gynecologic oncology case conference on 05/01/2018.  Recommendation was to send out pathology for cytogenetic analysis at [facility]   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   Previously Dr. Fransisca Connors discussed options for management including watchful waiting versus adjuvant hormonal therapy with Megace. She did not opt to proceed with Megace.   She is followed by Dr. Leonides Schanz and our team at [facility] She has been NED.  06/2018 She saw Dr. Leonides Schanz in late March. A suspicious lesion at the vaginal cuff was present and was biopsied: VAGINA, CUFF, BIOPSY: UNREMARKABLE SQUAMOUS MUCOSA. NEGATIVE FOR DYSPLASIA, VIRAL EFFECT, AND MALIGNANCY.   11/11/2018- CT abdomen/pelvis IMPRESSION: 1. Status post hysterectomy. No evidence of metastatic disease in the abdomen or pelvis. 2. Previously seen prominent right obturator and iliac lymph nodes are resolved. These were likely reactive lymph nodes in the postoperative setting.     Problem List: Patient Active Problem List   Diagnosis Date Noted  . Endometrial stromal sarcoma ([place]) 04/24/2018  . Fibroid uterus 04/05/2018  . Menorrhagia 04/05/2018    Past Medical History: Past Medical History:  Diagnosis Date  . Anemia   . Arthritis   . Cancer [facility])    uterine cancer, sarcoma   . Collagen vascular disease (HCC)    RA  . GERD (gastroesophageal reflux disease)   . Hypothyroidism   . Pneumonia    as a child  . Thyroid disease   . Vitamin D deficiency     Past Surgical History: Past Surgical History:  Procedure Laterality Date  . ABDOMINAL HYSTERECTOMY    . LAPAROSCOPIC BILATERAL SALPINGECTOMY Bilateral 04/05/2018   Procedure: LAPAROSCOPIC  BILATERAL SALPINGECTOMY;  Surgeon: Ward, Honor Loh, MD;  Location: [facility] ORS;  Service: Gynecology;  Laterality: Bilateral;  . LAPAROSCOPIC SUPRACERVICAL HYSTERECTOMY N/A 04/05/2018   Procedure: LAPAROSCOPIC SUPRACERVICAL HYSTERECTOMY, with powe morcellation;  Surgeon: Ward, Honor Loh, MD;  Location: [facility] ORS;  Service: Gynecology;  Laterality: N/A;  . NASAL SEPTUM SURGERY    . OMENTECTOMY  05/01/2018   Procedure: OMENTECTOMY, PARTIAL;  Surgeon: Ward, Honor Loh, MD;  Location: [facility] ORS;  Service: Gynecology;;  . Morene Crocker    . TONSILLECTOMY    . TRACHELECTOMY N/A 05/01/2018   Procedure: TRACHELECTOMY, VAGINAL;  Surgeon: Ward, Honor Loh, MD;  Location: [facility] ORS;  Service: Gynecology;  Laterality: N/A;    Past Gynecologic History:  No abnormal Paps Menarche 12 Sexually active  OB History:  OB History  Gravida Para Term Preterm AB Living  6 6          SAB TAB Ectopic Multiple Live Births               # Outcome Date GA Lbr Len/2nd Weight Sex Delivery Anes PTL Lv  6 Para           5 Para           4 Para           3 Para           2 Para           1 Para  Obstetric Comments  NSVD x 6    Family History: Family History  Problem Relation Age of Onset  . Non-Hodgkin's lymphoma Mother   . Heart disease Father   . CAD Father 88       CABG x 3 vessels  . Breast cancer Neg Hx     Social History: Social History   Socioeconomic History  . Marital status: Married    Spouse name: Not on file  . Number of children: Not on file  . Years of education: Not on file  . Highest education level: Not on file  Occupational History  . Not on file  Tobacco Use  . Smoking status: Never Smoker  . Smokeless tobacco: Never Used  Substance and Sexual Activity  . Alcohol use: Yes    Alcohol/week: 2.0 standard drinks    Types: 2 Glasses of wine per week  . Drug use: Never  . Sexual activity: Yes  Other Topics Concern  . Not on file  Social History Narrative  . Not on file    Social Determinants of Health   Financial Resource Strain:   . Difficulty of Paying Living Expenses: Not on file  Food Insecurity:   . Worried About Charity fundraiser in the Last Year: Not on file  . Ran Out of Food in the Last Year: Not on file  Transportation Needs:   . Lack of Transportation (Medical): Not on file  . Lack of Transportation (Non-Medical): Not on file  Physical Activity:   . Days of Exercise per Week: Not on file  . Minutes of Exercise per Session: Not on file  Stress:   . Feeling of Stress : Not on file  Social Connections:   . Frequency of Communication with Friends and Family: Not on file  . Frequency of Social Gatherings with Friends and Family: Not on file  . Attends Religious Services: Not on file  . Active Member of Clubs or Organizations: Not on file  . Attends Archivist Meetings: Not on file  . Marital Status: Not on file  Intimate Partner Violence:   . Fear of Current or Ex-Partner: Not on file  . Emotionally Abused: Not on file  . Physically Abused: Not on file  . Sexually Abused: Not on file    Allergies: No Known Allergies  Current Medications: Current Outpatient Medications  Medication Sig Dispense Refill  . acetaminophen (TYLENOL) 500 MG tablet Take 500 mg by mouth daily as needed for moderate pain or headache.    . Cholecalciferol (VITAMIN D3) 25 MCG (1000 UT) CAPS Take 1,000 Units by mouth 2 (two) times daily.    . ferrous sulfate 325 (65 FE) MG tablet Take 325 mg by mouth daily at 2 PM.     . folic acid (FOLVITE) 1 MG tablet Take 1 mg by mouth daily at 2 PM.     . methotrexate (RHEUMATREX) 2.5 MG tablet Take 2.5 mg by mouth every Wednesday. Caution:Chemotherapy. Protect from light.     . thyroid (ARMOUR THYROID) 90 MG tablet Take 90 mg by mouth daily.     Marland Kitchen ibuprofen (ADVIL,MOTRIN) 800 MG tablet Take 1 tablet (800 mg total) by mouth every 6 (six) hours. (Patient not taking: Reported on 05/29/2018) 45 tablet 1  . ondansetron  (ZOFRAN ODT) 4 MG disintegrating tablet Take 1 tablet (4 mg total) by mouth every 8 (eight) hours as needed for nausea or vomiting. (Patient not taking: Reported on 05/29/2018) 20 tablet 0  No current facility-administered medications for this visit.   Review of Systems General:  no complaints Skin: no complaints Eyes: no complaints HEENT: no complaints Breasts: no complaints Pulmonary: no complaints Cardiac: no complaints Gastrointestinal: no complaints Genitourinary/Sexual: no complaints Ob/Gyn: no complaints Musculoskeletal: no complaints Hematology: no complaints Neurologic/Psych: no complaints   OBJECTIVE:  Today's Vitals   05/28/19 1111  BP: 114/79  Pulse: 68  Resp: 18  Temp: (!) 96.7 F (35.9 C)  TempSrc: Tympanic  Weight: 162 lb (73.5 kg)  PainSc: 0-No pain   Body mass index is 26.15 kg/m.  ECOG: 0  GENERAL: Patient is a well appearing female in no acute distress HEENT:  Sclera clear. Anicteric NODES:  Negative axillary, supraclavicular, inguinal lymph node survery; Inguinal nodes were reassessed and the medial groin node is palpable but not enlarged.  LUNGS:  Clear to auscultation bilaterally.   HEART:  Regular rate and rhythm.  ABDOMEN:  Soft, nontender.  No hernias, incisions well healed. No masses or ascites EXTREMITIES:  No peripheral edema. Atraumatic. No cyanosis SKIN:  Clear with no obvious rashes or skin changes.  NEURO:  Nonfocal. Well oriented.  Appropriate affect.  Pelvic: exam chaperoned by nurse;  Vulva: normal appearing vulva with no masses, tenderness or lesions; Vagina: normal vagina; Adnexa: surgically absent; Uterus: surgically absent, vaginal cuff well healed; Cervix: absent; Rectal: not indicated   Radiology  CT 05/22/19 films reviewed      Assessment:  Karla Vance is a 49 y.o. 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   Enlarged distal iliac artery lymph node, asymptomatic.   Medical co-morbidities complicating care:  Plan:   Problem List Items Addressed This Visit      Genitourinary   Endometrial stromal sarcoma ([place]) - Primary    Other Visit Diagnoses    Adenopathy         Obtain FNA of the node. If negative continue close surveillance with exams and also with CT every 6 months in view of morcellation.   Based on NCCN guidelines we will plan to repeat. We will continue to alternate with Dr. Leonides Schanz if FNA negative. Every 3-4 mo for first 3 then every 6 months until 5 years. Annually thereafter; but we can release from [facility] clinic as she has good follow up with Dr. Leonides Schanz.   A total of at least 30 minutes were spent with the patient/family today; over 50% was spent in education, counseling and coordination of care for endometrial stromal sarcoma cancer and new enlarged adenopathy.  I personally saw the patients, reviewed the imaging, developed the assessment and plan, and performed all the counseling. Beckey Rutter, NP, scribed the note.  Nnaemeka Samson Gaetana Michaelis, MD  CC:  Referring Provider: Dr. Vikki Ports Ward

## 2019-06-02 ENCOUNTER — Other Ambulatory Visit: Payer: Self-pay | Admitting: Nurse Practitioner

## 2019-06-02 DIAGNOSIS — R599 Enlarged lymph nodes, unspecified: Secondary | ICD-10-CM

## 2019-06-02 DIAGNOSIS — C541 Malignant neoplasm of endometrium: Secondary | ICD-10-CM

## 2019-06-03 ENCOUNTER — Other Ambulatory Visit (HOSPITAL_COMMUNITY): Payer: Self-pay | Admitting: Nurse Practitioner

## 2019-06-03 ENCOUNTER — Telehealth: Payer: Self-pay | Admitting: Nurse Practitioner

## 2019-06-03 DIAGNOSIS — C541 Malignant neoplasm of endometrium: Secondary | ICD-10-CM

## 2019-06-03 DIAGNOSIS — R599 Enlarged lymph nodes, unspecified: Secondary | ICD-10-CM

## 2019-06-03 NOTE — Telephone Encounter (Signed)
Called patient to discuss plan for lymph node biopsy. Plan for Wednesday, 3/3. Arrive at 9 am for 10 am procedure. Nurse will call prior to appt to give instructions. Provided patient with my number to return call as she did not answer and I left message.

## 2019-06-05 ENCOUNTER — Telehealth: Payer: Self-pay | Admitting: Nurse Practitioner

## 2019-06-05 NOTE — Telephone Encounter (Signed)
No answer. Left message to return call.

## 2019-06-10 ENCOUNTER — Other Ambulatory Visit: Payer: Self-pay | Admitting: Radiology

## 2019-06-11 ENCOUNTER — Ambulatory Visit
Admission: RE | Admit: 2019-06-11 | Discharge: 2019-06-11 | Disposition: A | Discharge status: Home / Self Care | Payer: No Typology Code available for payment source | Source: Ambulatory Visit | Attending: Interventional Radiology | Admitting: Interventional Radiology

## 2019-06-11 ENCOUNTER — Ambulatory Visit
Admission: RE | Admit: 2019-06-11 | Discharge: 2019-06-11 | Disposition: A | Discharge status: Home / Self Care | Payer: No Typology Code available for payment source | Source: Ambulatory Visit | Attending: Nurse Practitioner | Admitting: Nurse Practitioner

## 2019-06-11 ENCOUNTER — Other Ambulatory Visit: Payer: Self-pay

## 2019-06-11 DIAGNOSIS — R590 Localized enlarged lymph nodes: Secondary | ICD-10-CM | POA: Diagnosis not present

## 2019-06-11 DIAGNOSIS — Z79899 Other long term (current) drug therapy: Secondary | ICD-10-CM | POA: Diagnosis not present

## 2019-06-11 DIAGNOSIS — E039 Hypothyroidism, unspecified: Secondary | ICD-10-CM | POA: Diagnosis not present

## 2019-06-11 DIAGNOSIS — K219 Gastro-esophageal reflux disease without esophagitis: Secondary | ICD-10-CM | POA: Insufficient documentation

## 2019-06-11 DIAGNOSIS — Z8542 Personal history of malignant neoplasm of other parts of uterus: Secondary | ICD-10-CM | POA: Diagnosis not present

## 2019-06-11 DIAGNOSIS — E559 Vitamin D deficiency, unspecified: Secondary | ICD-10-CM | POA: Insufficient documentation

## 2019-06-11 DIAGNOSIS — Z807 Family history of other malignant neoplasms of lymphoid, hematopoietic and related tissues: Secondary | ICD-10-CM | POA: Diagnosis not present

## 2019-06-11 DIAGNOSIS — Z79890 Hormone replacement therapy (postmenopausal): Secondary | ICD-10-CM | POA: Insufficient documentation

## 2019-06-11 DIAGNOSIS — D649 Anemia, unspecified: Secondary | ICD-10-CM | POA: Diagnosis not present

## 2019-06-11 DIAGNOSIS — M069 Rheumatoid arthritis, unspecified: Secondary | ICD-10-CM | POA: Insufficient documentation

## 2019-06-11 DIAGNOSIS — R599 Enlarged lymph nodes, unspecified: Secondary | ICD-10-CM

## 2019-06-11 DIAGNOSIS — Z8249 Family history of ischemic heart disease and other diseases of the circulatory system: Secondary | ICD-10-CM | POA: Insufficient documentation

## 2019-06-11 DIAGNOSIS — C541 Malignant neoplasm of endometrium: Secondary | ICD-10-CM

## 2019-06-11 HISTORY — DX: Malignant neoplasm of uterus, part unspecified: C55

## 2019-06-11 LAB — CBC
HCT: 42.6 % (ref 36.0–46.0)
Hemoglobin: 14.1 g/dL (ref 12.0–15.0)
MCH: 31.1 pg (ref 26.0–34.0)
MCHC: 33.1 g/dL (ref 30.0–36.0)
MCV: 94 fL (ref 80.0–100.0)
Platelets: 215 10*3/uL (ref 150–400)
RBC: 4.53 MIL/uL (ref 3.87–5.11)
RDW: 12.9 % (ref 11.5–15.5)
WBC: 4.8 10*3/uL (ref 4.0–10.5)
nRBC: 0 % (ref 0.0–0.2)

## 2019-06-11 LAB — PROTIME-INR
INR: 0.9 (ref 0.8–1.2)
Prothrombin Time: 12.4 seconds (ref 11.4–15.2)

## 2019-06-11 MED ORDER — FENTANYL CITRATE (PF) 100 MCG/2ML IJ SOLN
INTRAMUSCULAR | Status: AC | PRN
  Administered 2019-06-11: 50 ug via INTRAVENOUS

## 2019-06-11 MED ORDER — SODIUM CHLORIDE 0.9 % IV SOLN: INTRAVENOUS | Status: DC

## 2019-06-11 MED ORDER — MIDAZOLAM HCL 2 MG/2ML IJ SOLN
INTRAMUSCULAR | Status: AC
  Filled 2019-06-11: qty 2

## 2019-06-11 MED ORDER — FENTANYL CITRATE (PF) 100 MCG/2ML IJ SOLN
INTRAMUSCULAR | Status: AC
  Filled 2019-06-11: qty 2

## 2019-06-11 MED ORDER — MIDAZOLAM HCL 2 MG/2ML IJ SOLN
INTRAMUSCULAR | Status: AC | PRN
  Administered 2019-06-11: 1 mg via INTRAVENOUS

## 2019-06-11 NOTE — Discharge Instructions (Signed)

## 2019-06-11 NOTE — Procedures (Signed)
Pre procedural Dx: Left external iliac chain lymphadenopathy  Post procedural Dx: Same  Technically successful CT guided biopsy of left external iliac lymph node.   EBL: None.   Complications: None immediate.   Ronny Bacon, MD Pager #: [phone]

## 2019-06-11 NOTE — Consult Note (Signed)
Chief Complaint: Left external iliac lymphadenopathy  Referring Physician(s): Allen,Lauren G  Patient Status: [facility] - Out-pt  History of Present Illness: Karla Vance is a 49 y.o. female with past medical history significant for uterine sarcoma, rheumatoid arthritis and hypothyroidism who presents today for image guided biopsy of enlarged left external iliac chain lymph node.    Patient is currently without complaint.  Specifically, no chest pain, shortness of breath fever or chills.  Past Medical History:  Diagnosis Date  . Anemia   . Arthritis   . Cancer [facility])    uterine cancer, sarcoma   . Collagen vascular disease (HCC)    RA  . GERD (gastroesophageal reflux disease)   . Hypothyroidism   . Pneumonia    as a child  . Thyroid disease   . Uterine cancer ([place])   . Vitamin D deficiency     Past Surgical History:  Procedure Laterality Date  . ABDOMINAL HYSTERECTOMY    . LAPAROSCOPIC BILATERAL SALPINGECTOMY Bilateral 04/05/2018   Procedure: LAPAROSCOPIC BILATERAL SALPINGECTOMY;  Surgeon: Ward, Honor Loh, MD;  Location: [facility] ORS;  Service: Gynecology;  Laterality: Bilateral;  . LAPAROSCOPIC SUPRACERVICAL HYSTERECTOMY N/A 04/05/2018   Procedure: LAPAROSCOPIC SUPRACERVICAL HYSTERECTOMY, with powe morcellation;  Surgeon: Ward, Honor Loh, MD;  Location: [facility] ORS;  Service: Gynecology;  Laterality: N/A;  . NASAL SEPTUM SURGERY    . OMENTECTOMY  05/01/2018   Procedure: OMENTECTOMY, PARTIAL;  Surgeon: Ward, Honor Loh, MD;  Location: [facility] ORS;  Service: Gynecology;;  . Morene Crocker    . TONSILLECTOMY    . TRACHELECTOMY N/A 05/01/2018   Procedure: TRACHELECTOMY, VAGINAL;  Surgeon: Ward, Honor Loh, MD;  Location: [facility] ORS;  Service: Gynecology;  Laterality: N/A;    Allergies: Patient has no known allergies.  Medications: Prior to Admission medications   Medication Sig Start Date End Date Taking? Authorizing Provider  acetaminophen (TYLENOL) 500 MG tablet Take 500 mg by  mouth daily as needed for moderate pain or headache.   Yes [provider]  Cholecalciferol (VITAMIN D3) 25 MCG (1000 UT) CAPS Take 1,000 Units by mouth 2 (two) times daily.   Yes [provider]  ferrous sulfate 325 (65 FE) MG tablet Take 325 mg by mouth daily at 2 PM.    Yes [provider]  folic acid (FOLVITE) 1 MG tablet Take 1 mg by mouth daily at 2 PM.    Yes [provider]  ibuprofen (ADVIL,MOTRIN) 800 MG tablet Take 1 tablet (800 mg total) by mouth every 6 (six) hours. 05/01/18  Yes Ward, Honor Loh, MD  methotrexate (RHEUMATREX) 2.5 MG tablet Take 2.5 mg by mouth every Wednesday. Caution:Chemotherapy. Protect from light.    Yes [provider]  ondansetron (ZOFRAN ODT) 4 MG disintegrating tablet Take 1 tablet (4 mg total) by mouth every 8 (eight) hours as needed for nausea or vomiting. 05/01/18  Yes Ward, Honor Loh, MD  thyroid (ARMOUR THYROID) 90 MG tablet Take 90 mg by mouth daily.    Yes [provider]     Family History  Problem Relation Age of Onset  . Non-Hodgkin's lymphoma Mother   . Heart disease Father   . CAD Father 73       CABG x 3 vessels  . Breast cancer Neg Hx     Social History   Socioeconomic History  . Marital status: Married    Spouse name: Not on file  . Number of children: Not on file  . Years of education: Not  on file  . Highest education level: Not on file  Occupational History  . Not on file  Tobacco Use  . Smoking status: Never Smoker  . Smokeless tobacco: Never Used  Substance and Sexual Activity  . Alcohol use: Yes    Alcohol/week: 2.0 standard drinks    Types: 2 Glasses of wine per week  . Drug use: Never  . Sexual activity: Yes  Other Topics Concern  . Not on file  Social History Narrative  . Not on file   Social Determinants of Health   Financial Resource Strain:   . Difficulty of Paying Living Expenses: Not on file  Food Insecurity:   . Worried About Charity fundraiser in the  Last Year: Not on file  . Ran Out of Food in the Last Year: Not on file  Transportation Needs:   . Lack of Transportation (Medical): Not on file  . Lack of Transportation (Non-Medical): Not on file  Physical Activity:   . Days of Exercise per Week: Not on file  . Minutes of Exercise per Session: Not on file  Stress:   . Feeling of Stress : Not on file  Social Connections:   . Frequency of Communication with Friends and Family: Not on file  . Frequency of Social Gatherings with Friends and Family: Not on file  . Attends Religious Services: Not on file  . Active Member of Clubs or Organizations: Not on file  . Attends Archivist Meetings: Not on file  . Marital Status: Not on file    ECOG Status: 1 - Symptomatic but completely ambulatory  Review of Systems: A 12 point ROS discussed and pertinent positives are indicated in the HPI above.  All other systems are negative.  Review of Systems  Vital Signs: BP 101/84   Pulse 76   Temp 98.3 F (36.8 C) (Oral)   Resp 16   Ht 5\' 6"  (1.676 m)   Wt 72.6 kg   LMP 03/23/2018   SpO2 100%   BMI 25.82 kg/m   Physical Exam  Imaging: CT Abdomen Pelvis W Contrast  Result Date: 05/22/2019 CLINICAL DATA:  Endometrial stromal sarcoma status post supracervical hysterectomy, bilateral salpingectomy 04/05/2018 with omentectomy and trachelectomy 05/01/2018. Restaging. EXAM: CT ABDOMEN AND PELVIS WITH CONTRAST TECHNIQUE: Multidetector CT imaging of the abdomen and pelvis was performed using the standard protocol following bolus administration of intravenous contrast. CONTRAST:  169mL OMNIPAQUE IOHEXOL 300 MG/ML  SOLN COMPARISON:  11/11/2018 CT abdomen/pelvis. FINDINGS: Lower chest: No significant pulmonary nodules or acute consolidative airspace disease. Hepatobiliary: Normal liver size. No liver mass. Normal gallbladder with no radiopaque cholelithiasis. No biliary ductal dilatation. Pancreas: Normal, with no mass or duct dilation. Spleen:  Normal size. No mass. Adrenals/Urinary Tract: Normal adrenals. No hydronephrosis. Simple 2.9 cm posterior interpolar right renal cyst. Scattered subcentimeter hypodense left renal cortical lesions are too small to characterize and are unchanged, considered benign. Normal bladder. Stomach/Bowel: Normal non-distended stomach. Normal caliber small bowel with no small bowel wall thickening. Normal appendix. Oral contrast transits to the colon. Normal large bowel with no diverticulosis, large bowel wall thickening or pericolonic fat stranding. Vascular/Lymphatic: Minimally atherosclerotic nonaneurysmal abdominal aorta. Patent portal, splenic, hepatic and renal veins. Enlarged 1.7 cm short axis diameter left external iliac node (series 2/image 71), increased from 0.9 cm on 11/11/2018 CT. No additional pathologically enlarged lymph nodes in the abdomen or pelvis. Reproductive: Status post hysterectomy, with no abnormal findings at the vaginal cuff. No adnexal mass. Other: No  pneumoperitoneum, ascites or focal fluid collection. Musculoskeletal: No aggressive appearing focal osseous lesions. IMPRESSION: 1. New left external iliac lymphadenopathy, metastatic disease not excluded. 2. No additional potential sites of metastatic disease in the abdomen or pelvis. 3. Aortic Atherosclerosis (ICD10-I70.0). Electronically Signed   By: Ilona Sorrel M.D.   On: 05/22/2019 14:33    Labs:  CBC: No results for input(s): WBC, HGB, HCT, PLT in the last 8760 hours.  COAGS: No results for input(s): INR, APTT in the last 8760 hours.  BMP: No results for input(s): NA, K, CL, CO2, GLUCOSE, BUN, CALCIUM, CREATININE, GFRNONAA, GFRAA in the last 8760 hours.  Invalid input(s): CMP  LIVER FUNCTION TESTS: No results for input(s): BILITOT, AST, ALT, ALKPHOS, PROT, ALBUMIN in the last 8760 hours.  TUMOR MARKERS: No results for input(s): AFPTM, CEA, CA199, CHROMGRNA in the last 8760 hours.  Assessment and Plan:  Karla Vance is a  49 y.o. female with past medical history significant for uterine sarcoma, rheumatoid arthritis and hypothyroidism who presents today for image guided biopsy of enlarged left external iliac chain lymph node.    Patient is currently without complaint.    Risks and benefits of indeterminate left external iliac chain lymph node biopsy was discussed with the patient and/or patient's family including, but not limited to bleeding, infection, damage to adjacent structures or low yield requiring additional tests.  All of the questions were answered and there is agreement to proceed.  Consent signed and in chart.    Thank you for this interesting consult.  I greatly enjoyed meeting REEVE WICKENHAUSER and look forward to participating in their care.  A copy of this report was sent to the requesting provider on this date.  Electronically Signed: Sandi Mariscal, MD 06/11/2019, 9:39 AM   I spent a total of 15 Minutes in face to face in clinical consultation, greater than 50% of which was counseling/coordinating care for image guided left external iliac chain lymph node

## 2019-06-13 LAB — SURGICAL PATHOLOGY

## 2019-07-03 ENCOUNTER — Telehealth: Payer: Self-pay | Admitting: Nurse Practitioner

## 2019-07-03 NOTE — Telephone Encounter (Signed)
Called to review pathology results. No answer. Left message to return call.

## 2019-11-19 ENCOUNTER — Other Ambulatory Visit: Payer: Self-pay

## 2019-11-19 ENCOUNTER — Inpatient Hospital Stay
Payer: No Typology Code available for payment source | Attending: Obstetrics and Gynecology | Admitting: Obstetrics and Gynecology

## 2019-11-19 VITALS — BP 119/80 | HR 62 | Temp 98.9°F | Wt 165.8 lb

## 2019-11-19 DIAGNOSIS — Z90722 Acquired absence of ovaries, bilateral: Secondary | ICD-10-CM | POA: Diagnosis not present

## 2019-11-19 DIAGNOSIS — Z90710 Acquired absence of both cervix and uterus: Secondary | ICD-10-CM | POA: Insufficient documentation

## 2019-11-19 DIAGNOSIS — Z8542 Personal history of malignant neoplasm of other parts of uterus: Secondary | ICD-10-CM | POA: Diagnosis present

## 2019-11-19 DIAGNOSIS — Z08 Encounter for follow-up examination after completed treatment for malignant neoplasm: Secondary | ICD-10-CM | POA: Insufficient documentation

## 2019-11-19 DIAGNOSIS — R591 Generalized enlarged lymph nodes: Secondary | ICD-10-CM

## 2019-11-19 DIAGNOSIS — C541 Malignant neoplasm of endometrium: Secondary | ICD-10-CM

## 2019-11-19 NOTE — Progress Notes (Signed)
Gynecologic Oncology Interval Visit   Referring Provider: Dr. Vikki Ports Ward  Chief Complaint: Endometrial Stromal Sarcoma  Subjective:  ARITA SEVERTSON is a 49 y.o. 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   DIAGNOSIS:  A. LYMPH NODE, LEFT EXTERNAL ILIAC; CT-GUIDED CORE BIOPSY:  - BENIGN LYMPH NODE TISSUE WITH FOLLICULAR HYPERPLASIA.  - NEGATIVE FOR METASTASIS.    She saw Dr. Leonides Schanz 3 months ago and had a negative exam per her report. Today she says that she feels well and denies complaints.   Gynecologic Oncology History:  She was seen by Dr. Leonides Schanz for surgical management of symptomatic large fibroid uterus. On 04/05/18 she underwent a laparoscopic supracervical hysterectomy with power and hand morecellation and laparoscopic bilateral salpingectomy.  Uterus was noted to be significantly enlarged and thought to be due to fibroids, but the final report did not show fibroids.  Low grade ESS was present in 17 of 27 blocks. Morcellation was performed in bag per her note.   Pathology:  A. UTERUS WITHOUT CERVIX, AND WITH BILATERAL FALLOPIAN TUBES; SUPRACERVICAL HYSTERECTOMY WITH BILATERAL SALPINGECTOMY:  - LOW-GRADE ENDOMETRIAL STROMAL SARCOMA, MEASURING AT LEAST 3.0 CM IN LARGEST SAMPLED SECTION, PRESENT IN 17 OF 27 TISSUE BLOCKS. - ANGIOLYMPHATIC INVASION PRESENT.  - MARGIN STATUS AND STAGING CANNOT BE RELIABLY EVALUATED DUE TO MORCELLATION OF SPECIMEN.  - BILATERAL FALLOPIAN TUBES WITH NO SIGNIFICANT HISTOPATHOLOGIC CHANGE.   B. ABNORMAL UTERINE TISSUE; CURETTAGE:  - LOW-GRADE ENDOMETRIAL STROMAL SARCOMA, PRESENT IN 5 OF 5 TISSUE BLOCKS.   04/24/2018- CT Abdomen Pelvis W Contrast 1. No definite findings to suggest metastatic disease in the abdomen or pelvis. Lymph nodes in the right pelvis are upper normal for size and close attention on follow-up recommended. 2. Small volume free fluid in the cul-de-sac,  nonspecific.  Patient was seen by Dr. Theora Gianotti in clinic on 04/24/2018.  Options for management were discussed at that time including plan for surgery and removal of ovaries.  Postoperative adjuvant therapy would be based on pathology.  We discussed that pathology would need to complete cytogenetic analysis to evaluate for chromosomal translocations as we correctly distinguish between low-grade ESS and high-grade ESS.  On 05/01/2018 she underwent laparoscopic BSO, trachelectomy, sentinel lymph node injection, mapping, and sampling, partial omentectomy with Dr. Leonides Schanz and Dr. Fransisca Connors at [facility].  DIAGNOSIS:  A. OMENTUM; PARTIAL OMENTECTOMY:  - NEGATIVE FOR MALIGNANCY.  - PATCHY CHRONIC INFLAMMATION AND REACTIVE MESOTHELIAL CHANGES.   B. OVARIES, BILATERAL; OOPHORECTOMY:  - NEGATIVE FOR MALIGNANCY.  - FOLLICULAR CYST, 2.0 CM, UNILATERAL.  - CORTICAL INCLUSION CYSTS, BILATERAL.   C. SENTINEL LYMPH NODE, LEFT EXTERNAL ILIAC; EXCISION:  - NEGATIVE FOR MALIGNANCY, ONE LYMPH NODE (0/1).  - REACTIVE FOLLICULAR LYMPHOID HYPERPLASIA.   D. SENTINEL LYMPH NODE, RIGHT EXTERNAL ILIAC; EXCISION:  - NEGATIVE FOR MALIGNANCY, ONE LYMPH NODE (0/1).  - REACTIVE FOLLICULAR LYMPHOID HYPERPLASIA.   E. TISSUE FROM ANTERIOR BLADDER WALL; EXCISION:  - NEGATIVE FOR MALIGNANCY.  - MESOTHELIAL-LINED FIBROADIPOSE TISSUE; NO MUSCULARIS PROPRIA OR  UROTHELIUM IS PRESENT.   F. CERVIX; TRACHELECTOMY:  - NEGATIVE FOR MALIGNANCY.  - NECROSIS AND FOREIGN BODY REACTION DUE TO PREVIOUS HYSTERECTOMY.   DIAGNOSIS:  A. PELVIC WASHINGS:  - NEGATIVE FOR MALIGNANCY.  - NEUTROPHILS, CLUMPED MACROPHAGES, AND REACTIVE MESOTHELIAL CELLS.   Case was discussed at gynecologic oncology case conference on 05/01/2018.  Recommendation was to send out pathology for cytogenetic analysis at [facility].   05/08/2018-[facility] Pathology Review-  Diagnosis: endometrial stromal tumor, FISH, Ts- POSITIVE-result is abnormal and indicates  rearrangement involving the JAZF1 gene region at 7p15 consistent with low grade endometrial stromal tumor.   Previously Dr. Fransisca Connors discussed options for management including watchful waiting versus adjuvant hormonal therapy with Megace. She did not opt to proceed with Megace.   She is followed by Dr. Leonides Schanz and our team at [facility]. She has been NED.  06/2018 She saw Dr. Leonides Schanz in late March. A suspicious lesion at the vaginal cuff was present and was biopsied: VAGINA, CUFF, BIOPSY: UNREMARKABLE SQUAMOUS MUCOSA. NEGATIVE FOR DYSPLASIA, VIRAL EFFECT, AND MALIGNANCY.   11/11/2018- CT abdomen/pelvis IMPRESSION: 1. Status post hysterectomy. No evidence of metastatic disease in the abdomen or pelvis. 2. Previously seen prominent right obturator and iliac lymph nodes are resolved. These were likely reactive lymph nodes in the postoperative setting.   She saw Dr. Leonides Schanz on 04/15/2019 and had a negative exam.   She had a CT scan on 05/22/19    1. New left external iliac lymphadenopathy, metastatic disease not excluded. Measures 1.7 cm short axis increased from 0.9 cm on 11/11/2018 CT. No other enlarged lymph nodes.  2. No additional potential sites of metastatic disease in the abdomen or pelvis. 3. Aortic Atherosclerosis (ICD10-I70.0).   Problem List: Patient Active Problem List   Diagnosis Date Noted  . Endometrial stromal sarcoma ([place]) 04/24/2018  . Fibroid uterus 04/05/2018  . Menorrhagia 04/05/2018    Past Medical History: Past Medical History:  Diagnosis Date  . Anemia   . Arthritis   . Cancer [facility])    uterine cancer, sarcoma   . Collagen vascular disease (HCC)    RA  . GERD (gastroesophageal reflux disease)   . Hypothyroidism   . Pneumonia    as a child  . Thyroid disease   . Uterine cancer ([place])   . Vitamin D deficiency     Past Surgical History: Past Surgical History:  Procedure Laterality Date  . ABDOMINAL HYSTERECTOMY    . LAPAROSCOPIC BILATERAL SALPINGECTOMY Bilateral 04/05/2018    Procedure: LAPAROSCOPIC BILATERAL SALPINGECTOMY;  Surgeon: Ward, Honor Loh, MD;  Location: [facility] ORS;  Service: Gynecology;  Laterality: Bilateral;  . LAPAROSCOPIC SUPRACERVICAL HYSTERECTOMY N/A 04/05/2018   Procedure: LAPAROSCOPIC SUPRACERVICAL HYSTERECTOMY, with powe morcellation;  Surgeon: Ward, Honor Loh, MD;  Location: [facility] ORS;  Service: Gynecology;  Laterality: N/A;  . NASAL SEPTUM SURGERY    . OMENTECTOMY  05/01/2018   Procedure: OMENTECTOMY, PARTIAL;  Surgeon: Ward, Honor Loh, MD;  Location: [facility] ORS;  Service: Gynecology;;  . Morene Crocker    . TONSILLECTOMY    . TRACHELECTOMY N/A 05/01/2018   Procedure: TRACHELECTOMY, VAGINAL;  Surgeon: Ward, Honor Loh, MD;  Location: [facility] ORS;  Service: Gynecology;  Laterality: N/A;    Past Gynecologic History:  No abnormal Paps Menarche 1 Sexually active  OB History:  OB History  Gravida Para Term Preterm AB Living  6 6          SAB TAB Ectopic Multiple Live Births               # Outcome Date GA Lbr Len/2nd Weight Sex Delivery Anes PTL Lv  6 Para           5 Para           4 Para           3 Para           2 Para  1 Para             Obstetric Comments  NSVD x 6    Family History: Family History  Problem Relation Age of Onset  . Non-Hodgkin's lymphoma Mother   . Heart disease Father   . CAD Father 49       CABG x 3 vessels  . Breast cancer Neg Hx     Social History: Social History   Socioeconomic History  . Marital status: Married    Spouse name: Not on file  . Number of children: Not on file  . Years of education: Not on file  . Highest education level: Not on file  Occupational History  . Not on file  Tobacco Use  . Smoking status: Never Smoker  . Smokeless tobacco: Never Used  Vaping Use  . Vaping Use: Never used  Substance and Sexual Activity  . Alcohol use: Yes    Alcohol/week: 2.0 standard drinks    Types: 2 Glasses of wine per week  . Drug use: Never  . Sexual activity: Yes  Other Topics  Concern  . Not on file  Social History Narrative  . Not on file   Social Determinants of Health   Financial Resource Strain:   . Difficulty of Paying Living Expenses:   Food Insecurity:   . Worried About Charity fundraiser in the Last Year:   . Arboriculturist in the Last Year:   Transportation Needs:   . Film/video editor (Medical):   Marland Kitchen Lack of Transportation (Non-Medical):   Physical Activity:   . Days of Exercise per Week:   . Minutes of Exercise per Session:   Stress:   . Feeling of Stress :   Social Connections:   . Frequency of Communication with Friends and Family:   . Frequency of Social Gatherings with Friends and Family:   . Attends Religious Services:   . Active Member of Clubs or Organizations:   . Attends Archivist Meetings:   Marland Kitchen Marital Status:   Intimate Partner Violence:   . Fear of Current or Ex-Partner:   . Emotionally Abused:   Marland Kitchen Physically Abused:   . Sexually Abused:     Allergies: No Known Allergies  Current Medications: Current Outpatient Medications  Medication Sig Dispense Refill  . acetaminophen (TYLENOL) 500 MG tablet Take 500 mg by mouth daily as needed for moderate pain or headache.    . Cholecalciferol (VITAMIN D3) 25 MCG (1000 UT) CAPS Take 1,000 Units by mouth 2 (two) times daily.    . ferrous sulfate 325 (65 FE) MG tablet Take 325 mg by mouth daily at 2 PM.     . folic acid (FOLVITE) 1 MG tablet Take 1 mg by mouth daily at 2 PM.     . ibuprofen (ADVIL,MOTRIN) 800 MG tablet Take 1 tablet (800 mg total) by mouth every 6 (six) hours. 45 tablet 1  . methotrexate (RHEUMATREX) 2.5 MG tablet Take 2.5 mg by mouth every Wednesday. Caution:Chemotherapy. Protect from light.     . ondansetron (ZOFRAN ODT) 4 MG disintegrating tablet Take 1 tablet (4 mg total) by mouth every 8 (eight) hours as needed for nausea or vomiting. 20 tablet 0  . thyroid (ARMOUR THYROID) 90 MG tablet Take 90 mg by mouth daily.      No current  facility-administered medications for this visit.   Review of Systems General:  no complaints Skin: no complaints Eyes: no complaints HEENT: no complaints  Breasts: no complaints Pulmonary: no complaints Cardiac: no complaints Gastrointestinal: no complaints Genitourinary/Sexual: no complaints Ob/Gyn: no complaints Musculoskeletal: no complaints Hematology: no complaints Neurologic/Psych: no complaints   OBJECTIVE:  Today's Vitals   11/19/19 1100  BP: 119/80  Pulse: 62  Temp: 98.9 F (37.2 C)  TempSrc: Tympanic  SpO2: 100%  Weight: 165 lb 12.8 oz (75.2 kg)   Body mass index is 26.76 kg/m.  GENERAL: Patient is a well appearing female in no acute distress HEENT:  PERRL, neck supple with midline trachea. Thyroid without masses.  NODES:  No cervical, supraclavicular, axillary, or inguinal lymphadenopathy palpated. The previously palpated node is not appreciated. LUNGS:  Clear to auscultation bilaterally.   HEART:  Regular rate and rhythm.  ABDOMEN:  Soft, nontender, non distended. No ascites, palpable masses, or hernias.  MSK:  No focal spinal tenderness to palpation. Full range of motion bilaterally in the upper extremities. EXTREMITIES:  No peripheral edema.   SKIN:  Clear with no obvious rashes or skin changes other than a 8 mm subcutaneous lesion on her mid upper back.  NEURO:  Nonfocal. Well oriented.  Appropriate affect.  Pelvic: chaperoned by RN EGBUS: no lesions Cervix: surgically absent Vagina: no lesions, no discharge or bleeding, vaginal cuff well healed Uterus: surgically absent BME: no palpable masses Rectovaginal: not indicated   Radiology   As per HPI     Assessment:  LAURAJEAN HOSEK is a 49 y.o. 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 Clinically NED.   Enlarged distal iliac artery lymph node, asymptomatic with negative biopsy.   Skin lesion, uncertain etiology.   Medical co-morbidities complicating care:  Plan:   Problem List Items Addressed This Visit      Genitourinary   Endometrial stromal sarcoma ([place]) - Primary    Other Visit Diagnoses    Lymphadenopathy          CT every 6 months in view of morcellation. Plan for CT C/A/P now and then CT A/P in 6 months.  Based on NCCN guidelines we will plan to repeat. We will continue to alternate with Dr. Leonides Schanz. Every 3-4 mo for first 3 then every 6 months until 5 years. Annually thereafter; but we can release from [facility] clinic as she has good follow up with Dr. Leonides Schanz.   She has an appointment with Dr. Leonides Schanz in 3 months and will return to see [place] in ~ 6 months.   I personally had a face to face interaction and evaluated the patient jointly with the NP, Ms. Beckey Rutter.  I have reviewed her history and available records and have performed the key portions of the physical exam including lymph node survey, abdominal exam, pelvic exam with my findings confirming those documented above by the APP.  I have discussed the case with the APP and the patient.  I agree with the above documentation, assessment and plan which was fully formulated by me.  Counseling was completed by me.   I recommended that she see a dermatology to assess the skin lesion.   I personally saw the patient and performed a substantive portion of this encounter in conjunction with the listed APP as documented above.  Ledarrius Beauchaine Gaetana Michaelis, MD

## 2019-11-20 ENCOUNTER — Telehealth: Payer: Self-pay

## 2019-11-20 NOTE — Telephone Encounter (Signed)
Orders for CT CAP now and in 6 months faxed to  [facility] on [address]. No authorization required for in network facilities. Reference # [id number]. Notified Karla Vance that I provided them her phone to call and schedule. Asked her to let me know if she does not hear from them by the end of this week or Monday.

## 2019-11-26 ENCOUNTER — Ambulatory Visit: Payer: 59

## 2019-11-28 ENCOUNTER — Telehealth: Payer: Self-pay

## 2019-11-28 NOTE — Telephone Encounter (Signed)
Voicemail left with Ms. Karla Vance to return call to let us know if she has gotten her CT scan scheduled.

## 2019-12-01 ENCOUNTER — Other Ambulatory Visit: Payer: Self-pay

## 2019-12-01 ENCOUNTER — Ambulatory Visit
Admission: RE | Admit: 2019-12-01 | Discharge: 2019-12-01 | Disposition: A | Discharge status: Home / Self Care | Payer: Self-pay | Source: Ambulatory Visit | Attending: Obstetrics and Gynecology | Admitting: Obstetrics and Gynecology

## 2019-12-01 DIAGNOSIS — R591 Generalized enlarged lymph nodes: Secondary | ICD-10-CM

## 2019-12-01 DIAGNOSIS — C541 Malignant neoplasm of endometrium: Secondary | ICD-10-CM

## 2019-12-01 NOTE — Progress Notes (Signed)
[employer] request sent to [facility] for 11/26/19 CT CAP.

## 2019-12-04 ENCOUNTER — Telehealth: Payer: Self-pay

## 2019-12-04 NOTE — Telephone Encounter (Signed)
Voicemail left with Ms. Encarnacion regarding CT scan. Dr. Theora Gianotti has reviewed. She will have another CT scan in 6 months and see Dr. Theora Gianotti in clinic. She will see Dr. Leonides Schanz in 3 months.

## 2020-06-02 ENCOUNTER — Other Ambulatory Visit: Payer: Self-pay

## 2020-06-02 ENCOUNTER — Inpatient Hospital Stay
Payer: No Typology Code available for payment source | Attending: Obstetrics and Gynecology | Admitting: Obstetrics and Gynecology

## 2020-06-02 VITALS — BP 113/75 | HR 70 | Temp 96.4°F | Resp 20 | Wt 172.2 lb

## 2020-06-02 DIAGNOSIS — C541 Malignant neoplasm of endometrium: Secondary | ICD-10-CM

## 2020-06-02 DIAGNOSIS — Z90710 Acquired absence of both cervix and uterus: Secondary | ICD-10-CM | POA: Diagnosis not present

## 2020-06-02 DIAGNOSIS — Z90722 Acquired absence of ovaries, bilateral: Secondary | ICD-10-CM | POA: Insufficient documentation

## 2020-06-02 NOTE — Patient Instructions (Signed)
Please contact Dr. Guido Sander office for surveillance visit in 4 months (around 09/30/20).

## 2020-06-02 NOTE — Progress Notes (Signed)
Gynecologic Oncology Interval Visit   Referring Provider: Dr. Vikki Ports Ward  Chief Complaint: Endometrial Stromal Sarcoma  Subjective:  Karla COLLIGNON is a 50 y.o. 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   She previously had palpable iliac lymph node which has been followed:  - 0.9 cm on 11/11/2018  - increased to 1.7 cm on 05/22/19  - Biopsy was benign  Interval imaging to revaluate:   CT 05/24/20 C/A/P at [facility] (due to morcellation at surgery) --No evidence of intrathoracic metastatic disease.  - 1.1 cm left external iliac node, previously measured 1.5 cm. Additional subcentimeter right external iliac nodes overall similar to previous exam. - Ill-defined lucent lesion within the right posterior L1 vertebral body is indeterminate and unchanged from previous exam. Consider evaluation with nuclear medicine bone scan versus PET /CT.  She has not seen Dr. Leonides Schanz in the interim. Today, she feels well and denies complaints.   Gynecologic Oncology History:  She was seen by Dr. Leonides Schanz for surgical management of symptomatic large fibroid uterus. On 04/05/18 she underwent a laparoscopic supracervical hysterectomy with power and hand morecellation and laparoscopic bilateral salpingectomy.  Uterus was noted to be significantly enlarged and thought to be due to fibroids, but the final report did not show fibroids.  Low grade ESS was present in 17 of 27 blocks. Morcellation was performed in bag per her note.   Pathology:  A. UTERUS WITHOUT CERVIX, AND WITH BILATERAL FALLOPIAN TUBES; SUPRACERVICAL HYSTERECTOMY WITH BILATERAL SALPINGECTOMY:  - LOW-GRADE ENDOMETRIAL STROMAL SARCOMA, MEASURING AT LEAST 3.0 CM IN LARGEST SAMPLED SECTION, PRESENT IN 17 OF 27 TISSUE BLOCKS. - ANGIOLYMPHATIC INVASION PRESENT.  - MARGIN STATUS AND STAGING CANNOT BE RELIABLY EVALUATED DUE TO MORCELLATION OF SPECIMEN.  - BILATERAL FALLOPIAN TUBES WITH  NO SIGNIFICANT HISTOPATHOLOGIC CHANGE.   B. ABNORMAL UTERINE TISSUE; CURETTAGE:  - LOW-GRADE ENDOMETRIAL STROMAL SARCOMA, PRESENT IN 5 OF 5 TISSUE BLOCKS.   04/24/2018- CT Abdomen Pelvis W Contrast 1. No definite findings to suggest metastatic disease in the abdomen or pelvis. Lymph nodes in the right pelvis are upper normal for size and close attention on follow-up recommended. 2. Small volume free fluid in the cul-de-sac, nonspecific.  Patient was seen by Dr. Theora Gianotti in clinic on 04/24/2018.  Options for management were discussed at that time including plan for surgery and removal of ovaries.  Postoperative adjuvant therapy would be based on pathology.  We discussed that pathology would need to complete cytogenetic analysis to evaluate for chromosomal translocations as we correctly distinguish between low-grade ESS and high-grade ESS.  On 05/01/2018 she underwent laparoscopic BSO, trachelectomy, sentinel lymph node injection, mapping, and sampling, partial omentectomy with Dr. Leonides Schanz and Dr. Fransisca Connors at [facility].  DIAGNOSIS:  A. OMENTUM; PARTIAL OMENTECTOMY:  - NEGATIVE FOR MALIGNANCY.  - PATCHY CHRONIC INFLAMMATION AND REACTIVE MESOTHELIAL CHANGES.   B. OVARIES, BILATERAL; OOPHORECTOMY:  - NEGATIVE FOR MALIGNANCY.  - FOLLICULAR CYST, 2.0 CM, UNILATERAL.  - CORTICAL INCLUSION CYSTS, BILATERAL.   C. SENTINEL LYMPH NODE, LEFT EXTERNAL ILIAC; EXCISION:  - NEGATIVE FOR MALIGNANCY, ONE LYMPH NODE (0/1).  - REACTIVE FOLLICULAR LYMPHOID HYPERPLASIA.   D. SENTINEL LYMPH NODE, RIGHT EXTERNAL ILIAC; EXCISION:  - NEGATIVE FOR MALIGNANCY, ONE LYMPH NODE (0/1).  - REACTIVE FOLLICULAR LYMPHOID HYPERPLASIA.   E. TISSUE FROM ANTERIOR BLADDER WALL; EXCISION:  - NEGATIVE FOR MALIGNANCY.  - MESOTHELIAL-LINED FIBROADIPOSE TISSUE; NO MUSCULARIS PROPRIA OR  UROTHELIUM IS PRESENT.   F. CERVIX; TRACHELECTOMY:  -  NEGATIVE FOR MALIGNANCY.  - NECROSIS AND FOREIGN BODY REACTION DUE TO PREVIOUS HYSTERECTOMY.    DIAGNOSIS:  A. PELVIC WASHINGS:  - NEGATIVE FOR MALIGNANCY.  - NEUTROPHILS, CLUMPED MACROPHAGES, AND REACTIVE MESOTHELIAL CELLS.   Case was discussed at gynecologic oncology case conference on 05/01/2018.  Recommendation was to send out pathology for cytogenetic analysis at [facility].   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   Previously Dr. Fransisca Connors discussed options for management including watchful waiting versus adjuvant hormonal therapy with Megace. She did not opt to proceed with Megace.   She is followed by Dr. Leonides Schanz and our team at [facility]. She has been NED.  06/2018 She saw Dr. Leonides Schanz in late March. A suspicious lesion at the vaginal cuff was present and was biopsied: VAGINA, CUFF, BIOPSY: UNREMARKABLE SQUAMOUS MUCOSA. NEGATIVE FOR DYSPLASIA, VIRAL EFFECT, AND MALIGNANCY.   11/11/2018- CT abdomen/pelvis IMPRESSION: 1. Status post hysterectomy. No evidence of metastatic disease in the abdomen or pelvis. 2. Previously seen prominent right obturator and iliac lymph nodes are resolved. These were likely reactive lymph nodes in the postoperative setting.   She saw Dr. Leonides Schanz on 04/15/2019 and had a negative exam.   She had a CT scan on 05/22/19    1. New left external iliac lymphadenopathy, metastatic disease not excluded. Measures 1.7 cm short axis increased from 0.9 cm on 11/11/2018 CT. No other enlarged lymph nodes.  2. No additional potential sites of metastatic disease in the abdomen or pelvis. 3. Aortic Atherosclerosis (ICD10-I70.0).  DIAGNOSIS:  A. LYMPH NODE, LEFT EXTERNAL ILIAC; CT-GUIDED CORE BIOPSY:  - BENIGN LYMPH NODE TISSUE WITH FOLLICULAR HYPERPLASIA.  - NEGATIVE FOR METASTASIS.    Problem List: Patient Active Problem List   Diagnosis Date Noted  . Endometrial stromal sarcoma  ([place]) 04/24/2018  . Fibroid uterus 04/05/2018  . Menorrhagia 04/05/2018    Past Medical History: Past Medical History:  Diagnosis Date  . Anemia   . Arthritis   . Cancer [facility])    uterine cancer, sarcoma   . Collagen vascular disease (HCC)    RA  . GERD (gastroesophageal reflux disease)   . Hypothyroidism   . Pneumonia    as a child  . Thyroid disease   . Uterine cancer ([place])   . Vitamin D deficiency     Past Surgical History: Past Surgical History:  Procedure Laterality Date  . ABDOMINAL HYSTERECTOMY    . LAPAROSCOPIC BILATERAL SALPINGECTOMY Bilateral 04/05/2018   Procedure: LAPAROSCOPIC BILATERAL SALPINGECTOMY;  Surgeon: Ward, Honor Loh, MD;  Location: [facility] ORS;  Service: Gynecology;  Laterality: Bilateral;  . LAPAROSCOPIC SUPRACERVICAL HYSTERECTOMY N/A 04/05/2018   Procedure: LAPAROSCOPIC SUPRACERVICAL HYSTERECTOMY, with powe morcellation;  Surgeon: Ward, Honor Loh, MD;  Location: [facility] ORS;  Service: Gynecology;  Laterality: N/A;  . NASAL SEPTUM SURGERY    . OMENTECTOMY  05/01/2018   Procedure: OMENTECTOMY, PARTIAL;  Surgeon: Ward, Honor Loh, MD;  Location: [facility] ORS;  Service: Gynecology;;  . Morene Crocker    . TONSILLECTOMY    . TRACHELECTOMY N/A 05/01/2018   Procedure: TRACHELECTOMY, VAGINAL;  Surgeon: Ward, Honor Loh, MD;  Location: [facility] ORS;  Service: Gynecology;  Laterality: N/A;    Past Gynecologic History:  No abnormal Paps Menarche 66 Sexually active  OB History:  OB History  Gravida Para Term Preterm AB Living  6 6  SAB IAB Ectopic Multiple Live Births               # Outcome Date GA Lbr Len/2nd Weight Sex Delivery Anes PTL Lv  6 Para           5 Para           4 Para           3 Para           2 Para           1 Para             Obstetric Comments  NSVD x 6    Family History: Family History  Problem Relation Age of Onset  . Non-Hodgkin's lymphoma Mother   . Heart disease Father   . CAD Father 48       CABG x 3 vessels  . Breast  cancer Neg Hx     Social History: Social History   Socioeconomic History  . Marital status: Married    Spouse name: Not on file  . Number of children: Not on file  . Years of education: Not on file  . Highest education level: Not on file  Occupational History  . Not on file  Tobacco Use  . Smoking status: Never Smoker  . Smokeless tobacco: Never Used  Vaping Use  . Vaping Use: Never used  Substance and Sexual Activity  . Alcohol use: Yes    Alcohol/week: 2.0 standard drinks    Types: 2 Glasses of wine per week  . Drug use: Never  . Sexual activity: Yes  Other Topics Concern  . Not on file  Social History Narrative  . Not on file   Social Determinants of Health   Financial Resource Strain: Not on file  Food Insecurity: Not on file  Transportation Needs: Not on file  Physical Activity: Not on file  Stress: Not on file  Social Connections: Not on file  Intimate Partner Violence: Not on file    Allergies: No Known Allergies  Current Medications: Current Outpatient Medications  Medication Sig Dispense Refill  . acetaminophen (TYLENOL) 500 MG tablet Take 500 mg by mouth daily as needed for moderate pain or headache.    . Cholecalciferol (VITAMIN D3) 25 MCG (1000 UT) CAPS Take 1,000 Units by mouth 2 (two) times daily.    . ferrous sulfate 325 (65 FE) MG tablet Take 325 mg by mouth daily at 2 PM.     . folic acid (FOLVITE) 1 MG tablet Take 1 mg by mouth daily at 2 PM.     . ibuprofen (ADVIL,MOTRIN) 800 MG tablet Take 1 tablet (800 mg total) by mouth every 6 (six) hours. 45 tablet 1  . methotrexate (RHEUMATREX) 2.5 MG tablet Take 2.5 mg by mouth every Wednesday. Caution:Chemotherapy. Protect from light.     . ondansetron (ZOFRAN ODT) 4 MG disintegrating tablet Take 1 tablet (4 mg total) by mouth every 8 (eight) hours as needed for nausea or vomiting. 20 tablet 0  . thyroid (ARMOUR THYROID) 90 MG tablet Take 90 mg by mouth daily.      No current facility-administered  medications for this visit.   Review of Systems General:  no complaints Skin: no complaints Eyes: no complaints HEENT: no complaints Breasts: no complaints Pulmonary: no complaints Cardiac: no complaints Gastrointestinal: no complaints Genitourinary/Sexual: no complaints Ob/Gyn: no complaints Musculoskeletal: no complaints Hematology: no complaints Neurologic/Psych: no complaints   OBJECTIVE:  Today's Vitals  06/02/20 1306 06/02/20 1311  BP: 113/75   Pulse: 70   Resp: 20   Temp: (!) 96.4 F (35.8 C)   SpO2: 100%   Weight: 172 lb 3.2 oz (78.1 kg)   PainSc:  0-No pain   Body mass index is 27.79 kg/m.  GENERAL: Patient is a well appearing female in no acute distress HEENT:  Sclera clear. Anicteric NODES:  Negative axillary, supraclavicular, inguinal lymph node survery LUNGS:  Clear to auscultation bilaterally.   HEART:  Regular rate and rhythm.  ABDOMEN:  Soft, nontender.  No hernias, incisions well healed. No masses or ascites EXTREMITIES:  No peripheral edema. Atraumatic. No cyanosis SKIN:  Clear with no obvious rashes or skin changes.  NEURO:  Nonfocal. Well oriented.  Appropriate affect.  Pelvic: chaperoned by RN EGBUS: no lesions Cervix: surgically absent Vagina: no lesions, no discharge or bleeding, vaginal cuff well healed Uterus: surgically absent BME: no palpable masses Rectovaginal: normal   Radiology   Images reviewed independently by Dr. Fransisca Connors per HPI who agrees with findings.      Assessment:  BRANTLEE HINDE is a 50 y.o. 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 Clinically NED.  Normal surveillance CT scan 2/22.  Enlarged distal iliac artery lymph node, asymptomatic with negative biopsy  and not enlarging on recent CT scan.   Medical co-morbidities complicating care:  Plan:   Problem List Items Addressed This Visit      Genitourinary   Endometrial stromal sarcoma ([place]) - Primary     Plan has been for CT scan every 6 months in view of morcellation of the tumor, but unclear how long we should continue this as it has now been over 2 years since surgery. She will see Dr Leonides Schanz in 4 months and [place] in 8 months.  Can consider another scan at that time.   Beckey Rutter, DNP, AGNP-C [place] at [facility] [phone] (clinic)  I personally interviewed and examined the patient. Agreed with the above/below plan of care. I have directly contributed to assessment and plan of care of this patient and educated and discussed with patient and family.  Mellody Drown, MD

## 2020-07-28 IMAGING — CT CT BIOPSY
1 of 2 series · 14 of 32 positions shown, 18 images · non-contrast
Comparison: CT abdomen and pelvis-05/22/2019

INDICATION: History of uterine sarcoma, now with left external iliac chain
lymphadenopathy. Please perform image guided biopsy for tissue
diagnostic purposes.

EXAM:
CT AND ULTRASOUND-GUIDED BIOPSY OF LEFT EXTERNAL ILIAC CHAIN LYMPH
NODE

[Series 2: i-spiral 5.0 b30f · axial · 0.64mm/px · z∈[+961,+1188]mm · 14 of 74 slices shown, 18 images]
[im 6/74  soft-tissue]
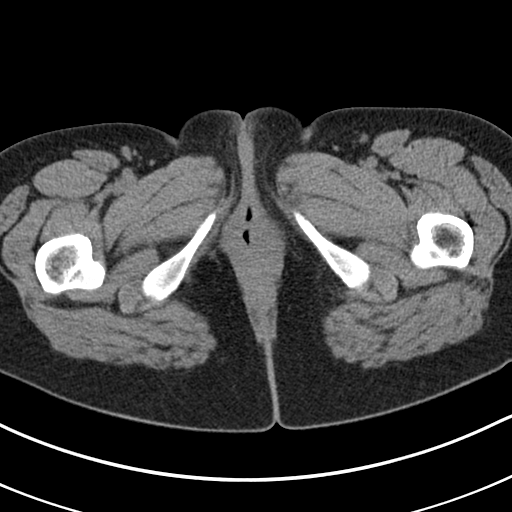
[im 6/74  bone]
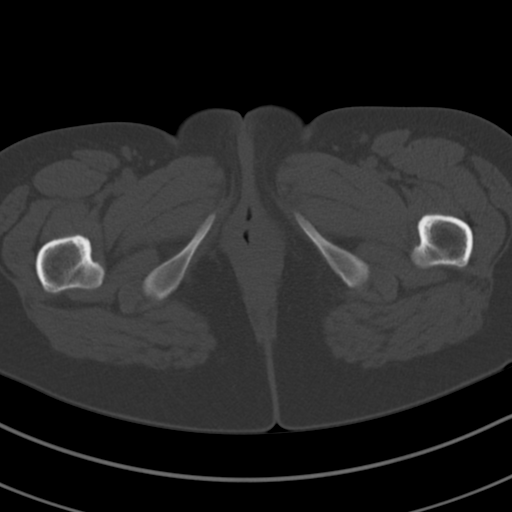
[im 11/74  soft-tissue]
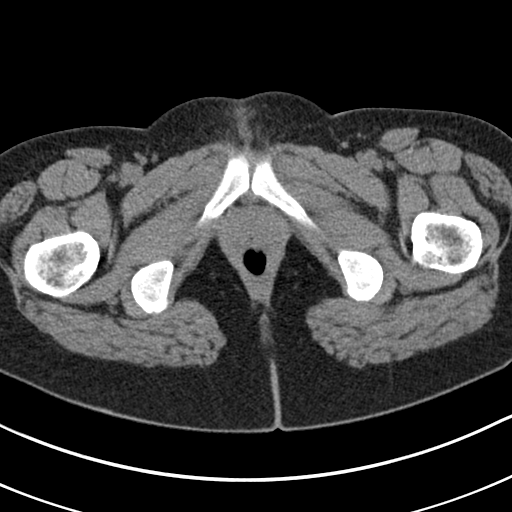
[im 16/74  soft-tissue]
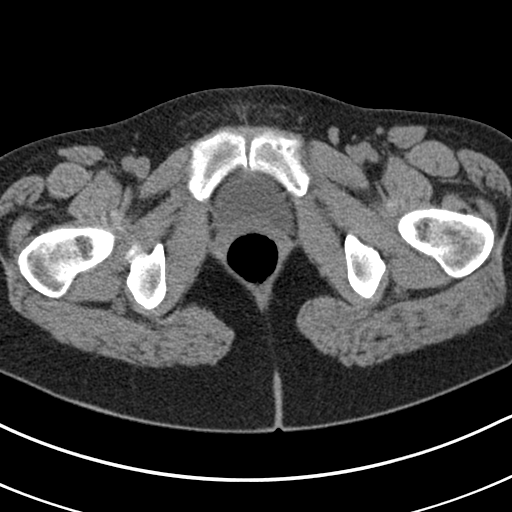
[im 21/74  soft-tissue]
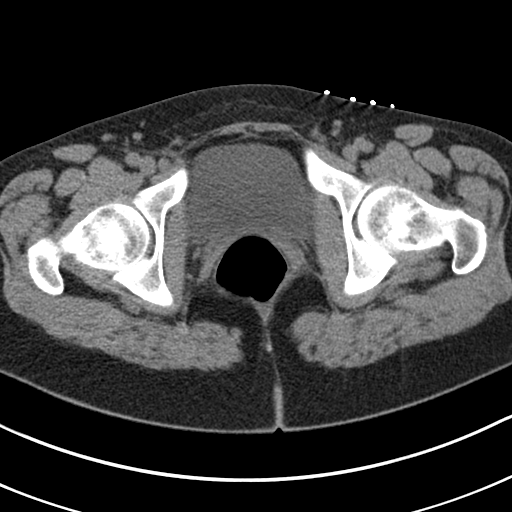
[im 29/74  soft-tissue]
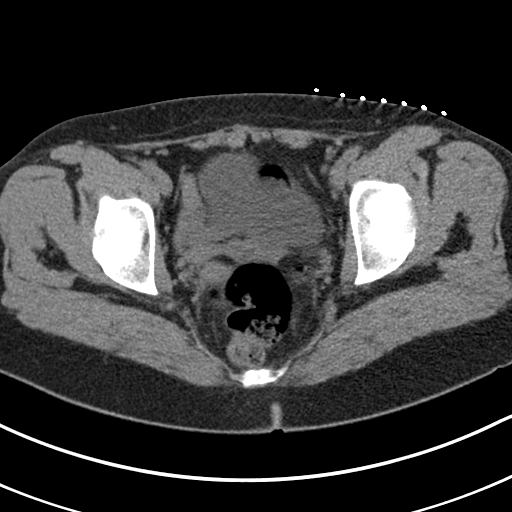
[im 34/74  soft-tissue]
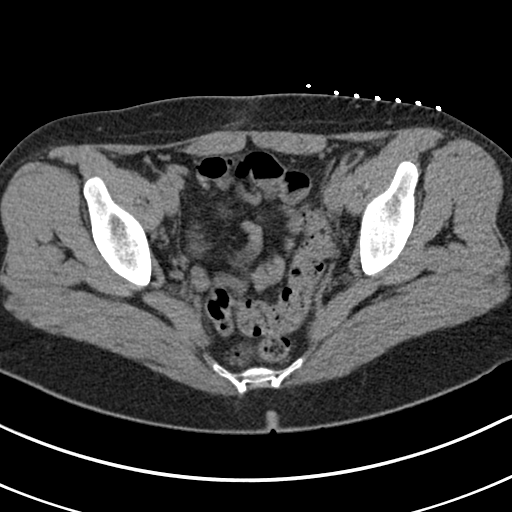
[im 40/74  soft-tissue]
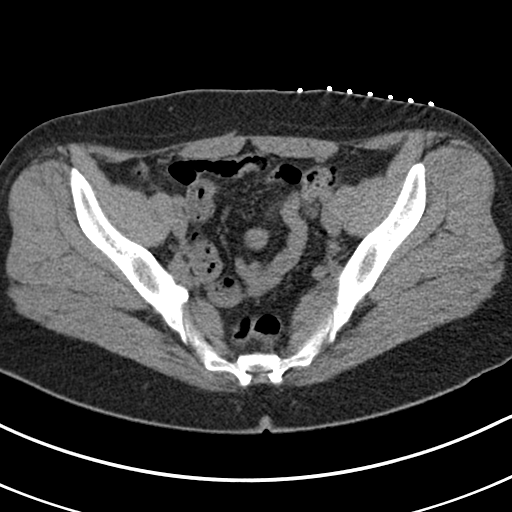
[im 45/74  soft-tissue]
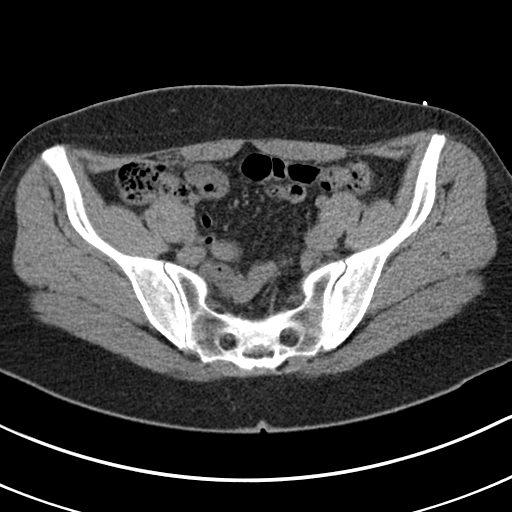
[im 53/74  soft-tissue]
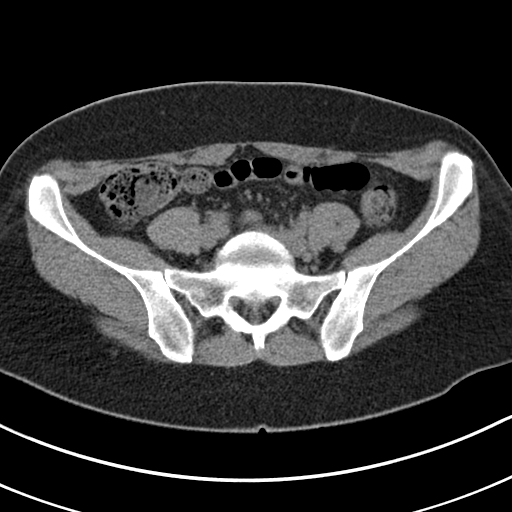
[im 53/74  bone]
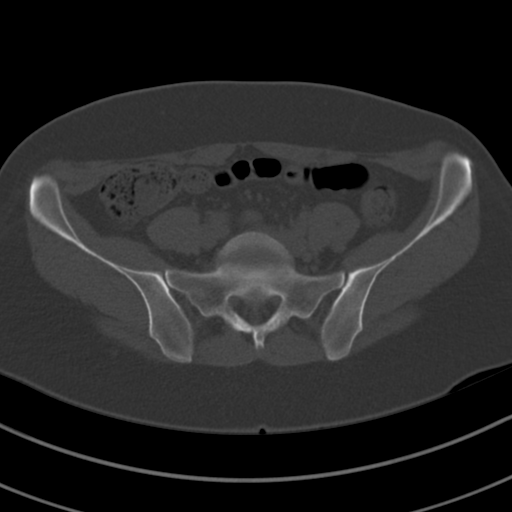
[im 58/74  soft-tissue]
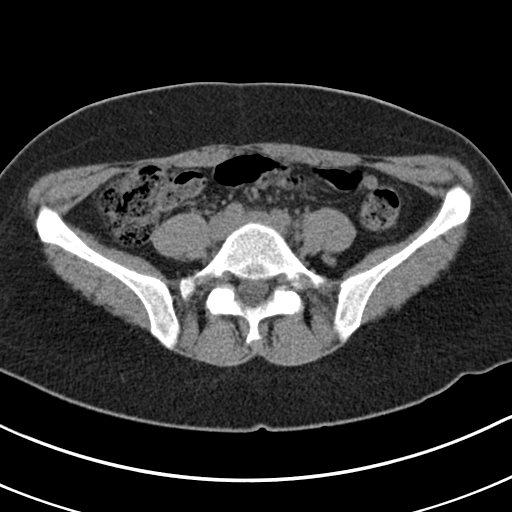
[im 63/74  soft-tissue]
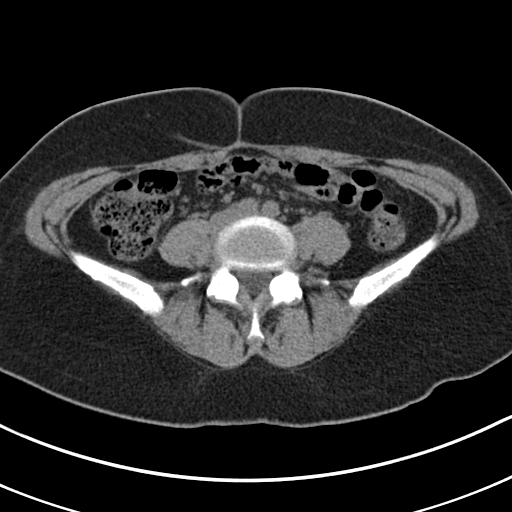
[im 63/74  lung]
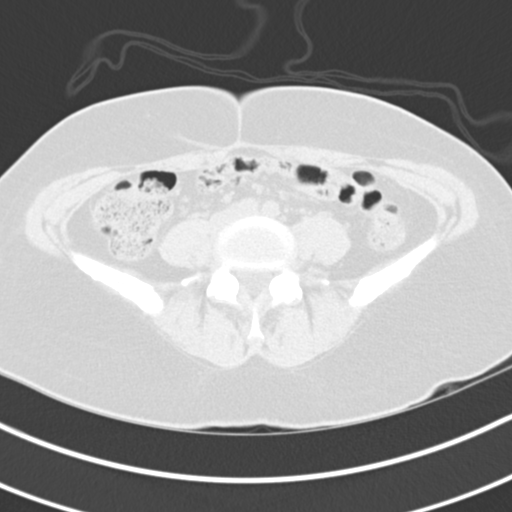
[im 66/74  lung]
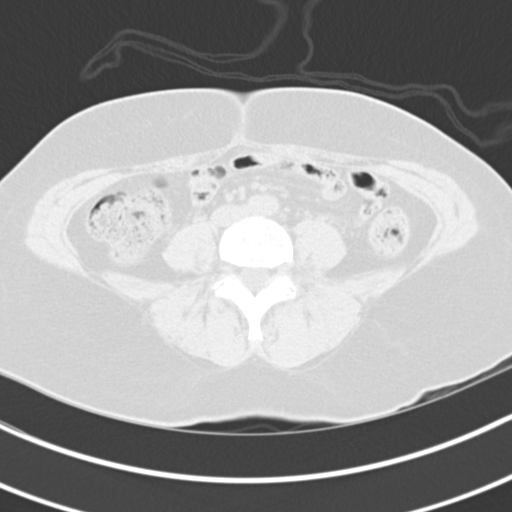
[im 68/74  soft-tissue]
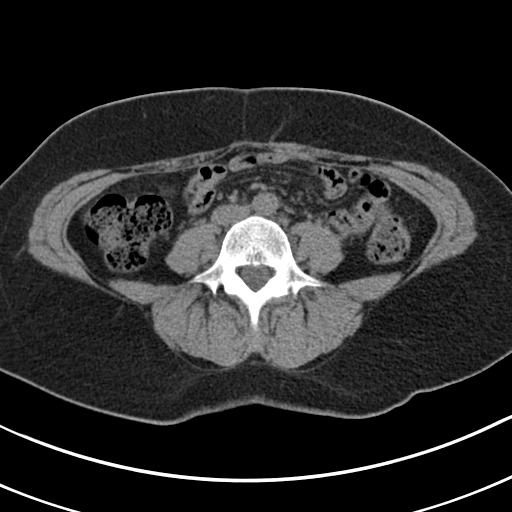
[im 68/74  lung]
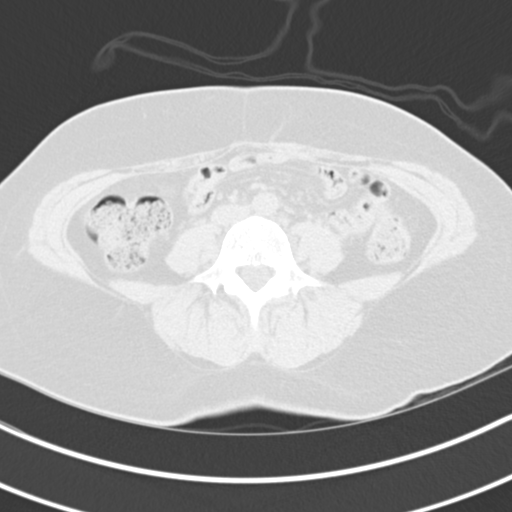
[im 71/74  lung]
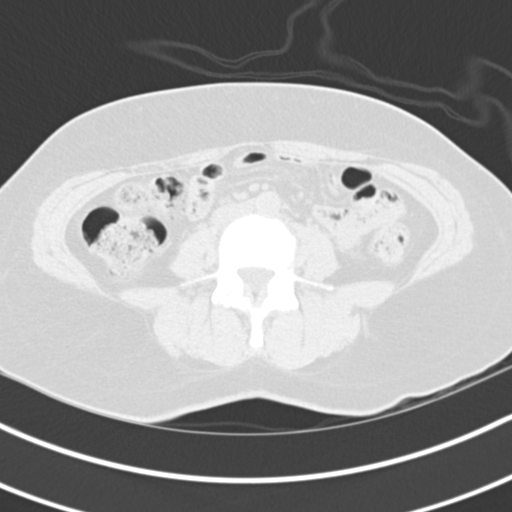

[14 of 32 positions shown; findings below may reference images not displayed]

MEDICATIONS:
None.

ANESTHESIA/SEDATION:
Fentanyl 50 mcg IV; Versed 1 mg IV

Sedation time: 15 minutes; The patient was continuously monitored
during the procedure by the interventional radiology nurse under my
direct supervision.

CONTRAST:  None.

COMPLICATIONS:
None immediate.

PROCEDURE:
Informed consent was obtained from the patient following an
explanation of the procedure, risks, benefits and alternatives. A
time out was performed prior to the initiation of the procedure.

The patient was positioned supine on the CT table and a limited CT
was performed for procedural planning demonstrating unchanged size
and appearance of the dominant left external iliac chain lymph node
measuring approximately 3.2 x 1.5 cm (image 49, series 2).

Attempts were made to visualize the external iliac chain lymph node
with ultrasound however this ultimately proved unsuccessful given
the depths of the lymph node as well as the adjacent osseous
structures.

As such the decision was made to proceed with lymph node biopsy with
CT guidance. The procedure was planned. The operative site was
prepped and draped in the usual sterile fashion. Appropriate
trajectory was confirmed with a 22 gauge spinal needle after the
adjacent tissues were anesthetized with 1% Lidocaine with
epinephrine.

Under intermittent CT guidance, a 17 gauge coaxial needle was
advanced into the peripheral aspect of the lymph node.

Appropriate positioning was confirmed and 4 core needle biopsy
samples were obtained with an 18 gauge core needle biopsy device.
The co-axial needle was removed and hemostasis was achieved with
manual compression.

A limited postprocedural CT was negative for hemorrhage or
additional complication. A dressing was placed. The patient
tolerated the procedure well without immediate postprocedural
complication.
IMPRESSION: Technically successful ultrasound and CT guided core needle biopsy
of left external iliac chain lymph node.

## 2020-10-06 ENCOUNTER — Encounter: Payer: Self-pay | Admitting: Obstetrics and Gynecology

## 2020-10-20 ENCOUNTER — Inpatient Hospital Stay
Payer: No Typology Code available for payment source | Attending: Obstetrics and Gynecology | Admitting: Obstetrics and Gynecology

## 2020-10-20 ENCOUNTER — Other Ambulatory Visit: Payer: Self-pay

## 2020-10-20 VITALS — BP 122/79 | HR 75 | Temp 97.8°F | Resp 20 | Wt 172.5 lb

## 2020-10-20 DIAGNOSIS — Z8542 Personal history of malignant neoplasm of other parts of uterus: Secondary | ICD-10-CM | POA: Diagnosis not present

## 2020-10-20 DIAGNOSIS — E039 Hypothyroidism, unspecified: Secondary | ICD-10-CM | POA: Diagnosis not present

## 2020-10-20 DIAGNOSIS — Z90722 Acquired absence of ovaries, bilateral: Secondary | ICD-10-CM | POA: Diagnosis not present

## 2020-10-20 DIAGNOSIS — Z79899 Other long term (current) drug therapy: Secondary | ICD-10-CM | POA: Insufficient documentation

## 2020-10-20 DIAGNOSIS — M1990 Unspecified osteoarthritis, unspecified site: Secondary | ICD-10-CM | POA: Insufficient documentation

## 2020-10-20 DIAGNOSIS — K219 Gastro-esophageal reflux disease without esophagitis: Secondary | ICD-10-CM | POA: Insufficient documentation

## 2020-10-20 DIAGNOSIS — Z9079 Acquired absence of other genital organ(s): Secondary | ICD-10-CM | POA: Diagnosis not present

## 2020-10-20 DIAGNOSIS — E559 Vitamin D deficiency, unspecified: Secondary | ICD-10-CM | POA: Insufficient documentation

## 2020-10-20 DIAGNOSIS — M069 Rheumatoid arthritis, unspecified: Secondary | ICD-10-CM | POA: Insufficient documentation

## 2020-10-20 DIAGNOSIS — Z90710 Acquired absence of both cervix and uterus: Secondary | ICD-10-CM | POA: Insufficient documentation

## 2020-10-20 DIAGNOSIS — Z8541 Personal history of malignant neoplasm of cervix uteri: Secondary | ICD-10-CM | POA: Diagnosis present

## 2020-10-20 DIAGNOSIS — C541 Malignant neoplasm of endometrium: Secondary | ICD-10-CM

## 2020-10-20 NOTE — Progress Notes (Signed)
Gynecologic Oncology Interval Visit   Referring Provider: Dr. Vikki Ports Ward  Chief Complaint: Endometrial Stromal Sarcoma  Subjective:  Karla Vance is a 50 y.o. 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 She was last seen in clinic on 06/02/2020.   She previously had palpable iliac lymph node which has been followed:  - 0.9 cm on 11/11/2018  - increased to 1.7 cm on 05/22/19  - Biopsy was benign  Interval imaging to revaluate:   CT 05/24/20 C/A/P at [facility] (due to morcellation at surgery) --No evidence of intrathoracic metastatic disease.   - 1.1 cm left external iliac node, previously measured 1.5 cm. Additional subcentimeter right external iliac nodes overall similar to previous exam. - Ill-defined lucent lesion within the right posterior L1 vertebral body is indeterminate and unchanged from previous exam. Consider evaluation with nuclear medicine bone scan versus PET /CT.  She has not seen Dr. Leonides Schanz in the interim. Today, she feels well and denies complaints.   Gynecologic Oncology History:  She was seen by Dr. Leonides Schanz for surgical management of symptomatic large fibroid uterus. On 04/05/18 she underwent a laparoscopic supracervical hysterectomy with power and hand morecellation and laparoscopic bilateral salpingectomy.  Uterus was noted to be significantly enlarged and thought to be due to fibroids, but the final report did not show fibroids.  Low grade ESS was present in 17 of 27 blocks. Morcellation was performed in bag per her note.   Pathology:  A. UTERUS WITHOUT CERVIX, AND WITH BILATERAL FALLOPIAN TUBES; SUPRACERVICAL HYSTERECTOMY WITH BILATERAL SALPINGECTOMY:  - LOW-GRADE ENDOMETRIAL STROMAL SARCOMA, MEASURING AT LEAST 3.0 CM IN LARGEST SAMPLED SECTION, PRESENT IN 17 OF 27 TISSUE BLOCKS. - ANGIOLYMPHATIC INVASION PRESENT.  - MARGIN STATUS AND STAGING CANNOT BE RELIABLY EVALUATED DUE TO MORCELLATION OF  SPECIMEN.  - BILATERAL FALLOPIAN TUBES WITH NO SIGNIFICANT HISTOPATHOLOGIC CHANGE.   B. ABNORMAL UTERINE TISSUE; CURETTAGE:  - LOW-GRADE ENDOMETRIAL STROMAL SARCOMA, PRESENT IN 5 OF 5 TISSUE BLOCKS.   04/24/2018- CT Abdomen Pelvis W Contrast 1. No definite findings to suggest metastatic disease in the abdomen or pelvis. Lymph nodes in the right pelvis are upper normal for size and close attention on follow-up recommended. 2. Small volume free fluid in the cul-de-sac, nonspecific.  Patient was seen by Dr. Theora Gianotti in clinic on 04/24/2018.  Options for management were discussed at that time including plan for surgery and removal of ovaries.  Postoperative adjuvant therapy would be based on pathology.  We discussed that pathology would need to complete cytogenetic analysis to evaluate for chromosomal translocations as we correctly distinguish between low-grade ESS and high-grade ESS.  On 05/01/2018 she underwent laparoscopic BSO, trachelectomy, sentinel lymph node injection, mapping, and sampling, partial omentectomy with Dr. Leonides Schanz and Dr. Fransisca Connors at [facility].  DIAGNOSIS:  A. OMENTUM; PARTIAL OMENTECTOMY:  - NEGATIVE FOR MALIGNANCY.  - PATCHY CHRONIC INFLAMMATION AND REACTIVE MESOTHELIAL CHANGES.   B. OVARIES, BILATERAL; OOPHORECTOMY:  - NEGATIVE FOR MALIGNANCY.  - FOLLICULAR CYST, 2.0 CM, UNILATERAL.  - CORTICAL INCLUSION CYSTS, BILATERAL.   C. SENTINEL LYMPH NODE, LEFT EXTERNAL ILIAC; EXCISION:  - NEGATIVE FOR MALIGNANCY, ONE LYMPH NODE (0/1).  - REACTIVE FOLLICULAR LYMPHOID HYPERPLASIA.   D. SENTINEL LYMPH NODE, RIGHT EXTERNAL ILIAC; EXCISION:  - NEGATIVE FOR MALIGNANCY, ONE LYMPH NODE (0/1).  - REACTIVE FOLLICULAR LYMPHOID HYPERPLASIA.   E. TISSUE FROM ANTERIOR BLADDER WALL; EXCISION:  - NEGATIVE FOR MALIGNANCY.  - MESOTHELIAL-LINED FIBROADIPOSE TISSUE; NO MUSCULARIS PROPRIA OR  UROTHELIUM IS PRESENT.   F. CERVIX; TRACHELECTOMY:  - NEGATIVE FOR MALIGNANCY.  - NECROSIS AND FOREIGN BODY  REACTION DUE TO PREVIOUS HYSTERECTOMY.   DIAGNOSIS:  A. PELVIC WASHINGS:  - NEGATIVE FOR MALIGNANCY.  - NEUTROPHILS, CLUMPED MACROPHAGES, AND REACTIVE MESOTHELIAL CELLS.   Case was discussed at gynecologic oncology case conference on 05/01/2018.  Recommendation was to send out pathology for cytogenetic analysis at [facility].   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   Previously Dr. Fransisca Connors discussed options for management including watchful waiting versus adjuvant hormonal therapy with Megace. She did not opt to proceed with Megace.   She is followed by Dr. Leonides Schanz and our team at [facility]. She has been NED.  06/2018 She saw Dr. Leonides Schanz in late March. A suspicious lesion at the vaginal cuff was present and was biopsied: VAGINA, CUFF, BIOPSY: UNREMARKABLE SQUAMOUS MUCOSA. NEGATIVE FOR DYSPLASIA, VIRAL EFFECT, AND MALIGNANCY.   11/11/2018- CT abdomen/pelvis IMPRESSION: 1. Status post hysterectomy. No evidence of metastatic disease in the abdomen or pelvis. 2. Previously seen prominent right obturator and iliac lymph nodes are resolved. These were likely reactive lymph nodes in the postoperative setting.   She saw Dr. Leonides Schanz on 04/15/2019 and had a negative exam.   She had a CT scan on 05/22/19    1. New left external iliac lymphadenopathy, metastatic disease not excluded. Measures 1.7 cm short axis increased from 0.9 cm on 11/11/2018 CT. No other enlarged lymph nodes.  2. No additional potential sites of metastatic disease in the abdomen or pelvis. 3. Aortic Atherosclerosis (ICD10-I70.0).  DIAGNOSIS:  A. LYMPH NODE, LEFT EXTERNAL ILIAC; CT-GUIDED CORE BIOPSY:  - BENIGN LYMPH NODE TISSUE WITH FOLLICULAR HYPERPLASIA.  - NEGATIVE FOR METASTASIS.    Problem List: Patient Active Problem List   Diagnosis Date  Noted   Endometrial stromal sarcoma ([place]) 04/24/2018   Fibroid uterus 04/05/2018   Menorrhagia 04/05/2018    Past Medical History: Past Medical History:  Diagnosis Date   Anemia    Arthritis    Cancer ([place])    uterine cancer, sarcoma    Collagen vascular disease ([place])    RA   GERD (gastroesophageal reflux disease)    Hypothyroidism    Pneumonia    as a child   Thyroid disease    Uterine cancer ([place])    Vitamin D deficiency     Past Surgical History: Past Surgical History:  Procedure Laterality Date   ABDOMINAL HYSTERECTOMY     LAPAROSCOPIC BILATERAL SALPINGECTOMY Bilateral 04/05/2018   Procedure: LAPAROSCOPIC BILATERAL SALPINGECTOMY;  Surgeon: Ward, Honor Loh, MD;  Location: [facility] ORS;  Service: Gynecology;  Laterality: Bilateral;   LAPAROSCOPIC SUPRACERVICAL HYSTERECTOMY N/A 04/05/2018   Procedure: LAPAROSCOPIC SUPRACERVICAL HYSTERECTOMY, with powe morcellation;  Surgeon: Ward, Honor Loh, MD;  Location: [facility] ORS;  Service: Gynecology;  Laterality: N/A;   NASAL SEPTUM SURGERY     OMENTECTOMY  05/01/2018   Procedure: OMENTECTOMY, PARTIAL;  Surgeon: Ward, Honor Loh, MD;  Location: [facility] ORS;  Service: Gynecology;;   OOPHORECTOMY     TONSILLECTOMY     TRACHELECTOMY N/A 05/01/2018   Procedure: TRACHELECTOMY, VAGINAL;  Surgeon: Maceo Pro, MD;  Location: [facility] ORS;  Service: Gynecology;  Laterality: N/A;   Past Gynecologic History:  No abnormal Paps Menarche 42 Sexually active  OB History:  OB History  Gravida Para Term Preterm AB Living  6 6          SAB IAB Ectopic Multiple Live Births               # Outcome Date GA Lbr Len/2nd Weight Sex Delivery Anes PTL Lv  6 Para           5 Para           4 Para           3 Para           2 Para           1 Para             Obstetric Comments  NSVD x 6    Family History: Family History  Problem Relation Age of Onset   Non-Hodgkin's lymphoma Mother    Heart disease Father    CAD Father 62       CABG x 3 vessels    Breast cancer Neg Hx     Social History: Social History   Socioeconomic History   Marital status: Married    Spouse name: Not on file   Number of children: Not on file   Years of education: Not on file   Highest education level: Not on file  Occupational History   Not on file  Tobacco Use   Smoking status: Never   Smokeless tobacco: Never  Vaping Use   Vaping Use: Never used  Substance and Sexual Activity   Alcohol use: Yes    Alcohol/week: 2.0 standard drinks    Types: 2 Glasses of wine per week   Drug use: Never   Sexual activity: Yes  Other Topics Concern   Not on file  Social History Narrative   Not on file   Social Determinants of Health   Financial Resource Strain: Not on file  Food Insecurity: Not on file  Transportation Needs: Not on file  Physical Activity: Not on file  Stress: Not on file  Social Connections: Not on file  Intimate Partner Violence: Not on file   Allergies: No Known Allergies  Current Medications: Current Outpatient Medications  Medication Sig Dispense Refill   acetaminophen (TYLENOL) 500 MG tablet Take 500 mg by mouth daily as needed for moderate pain or headache.     Cholecalciferol (VITAMIN D3) 25 MCG (1000 UT) CAPS Take 1,000 Units by mouth 2 (two) times daily.     ferrous sulfate 325 (65 FE) MG tablet Take 325 mg by mouth daily at 2 PM.  (Patient not taking: Reported on 4/31/5400)     folic acid (FOLVITE) 1 MG tablet Take 1 mg by mouth daily at 2 PM.      ibuprofen (ADVIL,MOTRIN) 800 MG tablet Take 1 tablet (800 mg total) by mouth every 6 (six) hours. (Patient not taking: Reported on 06/02/2020) 45 tablet 1   methotrexate (RHEUMATREX) 2.5 MG tablet Take 2.5 mg by mouth every Wednesday. Caution:Chemotherapy. Protect from light.     Multiple Vitamins-Minerals (MULTIVITAMIN WITH MINERALS) tablet Take 1 tablet by mouth daily.     ondansetron (ZOFRAN ODT) 4 MG disintegrating tablet Take 1 tablet (4 mg total) by mouth every 8 (eight)  hours as needed for nausea or vomiting. 20 tablet 0   thyroid (ARMOUR) 90 MG tablet Take 90 mg by mouth daily.      No current facility-administered medications for this visit.   Review of Systems Review of Systems  Constitutional:  Negative for fever, malaise/fatigue and weight loss.  HENT:  Negative for congestion and hearing loss.   Eyes:  Negative for blurred vision and double vision.  Respiratory:  Negative for cough.   Cardiovascular:  Negative for chest pain and palpitations.  Gastrointestinal:  Negative for abdominal pain, constipation, diarrhea, nausea and vomiting.  Genitourinary:  Negative for frequency and urgency.  Skin:  Negative for rash.  Neurological:  Negative for dizziness, tingling and headaches.  Endo/Heme/Allergies:  Does not bruise/bleed easily.  Psychiatric/Behavioral:  Negative for depression. The patient is not nervous/anxious and does not have insomnia.     OBJECTIVE:  Today's Vitals   10/20/20 1000 10/20/20 1003  BP: 122/79   Pulse: 75   Resp: 20   Temp: 97.8 F (36.6 C)   SpO2: 100%   Weight: 172 lb 8 oz (78.2 kg)   PainSc:  0-No pain    Body mass index is 27.84 kg/m.  GENERAL: Patient is a well appearing female in no acute distress HEENT:  Sclera clear. Anicteric NODES:  Negative axillary, supraclavicular, inguinal lymph node survery LUNGS:  Clear to auscultation bilaterally.   HEART:  Regular rate and rhythm.  ABDOMEN:  Soft, nontender.  No hernias, incisions well healed. No masses or ascites EXTREMITIES:  No peripheral edema. Atraumatic. No cyanosis SKIN:  Clear with no obvious rashes or skin changes.  NEURO:  Nonfocal. Well oriented.  Appropriate affect.  Pelvic: chaperoned by RN EGBUS: no lesions Cervix: surgically absent Vagina: no lesions, no discharge or bleeding, vaginal cuff well healed Uterus: surgically absent BME: no palpable masses Rectovaginal: normal   Radiology   Images reviewed independently by Dr. Fransisca Connors per HPI  who agrees with findings.     Assessment:  Karla Vance is a 50 y.o. 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 Clinically NED.  Normal surveillance CT scan 2/22. NED today on exam.   Enlarged distal iliac artery lymph node, asymptomatic with negative biopsy and not enlarging on recent CT scan.  In view of 3 cm Low Grade ESS, risk of recurrence is relatively low at this point.  Will repeat CT scan in one year from last (2/23)  Medical co-morbidities complicating care:  Plan:   Problem List Items Addressed This Visit       Genitourinary   Endometrial stromal sarcoma ([place]) - Primary    Plan has been for CT scan every 6 months in view of morcellation of the tumor, but unclear how long we should continue this as it has now been over 2 years since surgery. She is seeing [place] for her 4 month follow up, and will see a provider at [facility] 4 months from now. She will see [place] again 8 months from now. Will do next CT in 2/23 which is one year after her last one. If that is reassuring would stop routine scans.    The patient's diagnosis, an outline of the further diagnostic and laboratory studies which will be required, the recommendation for surgery, and alternatives were discussed with her and her accompanying family members.  All questions were answered to their satisfaction.  I personally had a face to face interaction and evaluated the patient jointly with the NP Student, Mrs. Benedetto Goad.  I have reviewed her history and available records and have performed the key portions of the physical exam including general,  HEENT, abdominal exam, pelvic exam with my findings confirming those documented above by the APP student.  I have  discussed the case with the APP student and the patient.  I agree with the above documentation, assessment and plan which was fully formulated by me.  Counseling was completed by me.   Benedetto Goad, Student FNP  I personally interviewed and examined the patient. Agreed with the above/below plan of care. I have directly contributed to assessment and plan of care of this patient and educated and discussed with patient and family.  Mellody Drown, MD

## 2021-02-02 ENCOUNTER — Ambulatory Visit: Payer: No Typology Code available for payment source

## 2021-05-04 ENCOUNTER — Telehealth: Payer: Self-pay | Admitting: Obstetrics and Gynecology

## 2021-05-04 NOTE — Telephone Encounter (Signed)
Pt called to reschedule her appt for 3-15. Call back at [phone]

## 2021-05-06 ENCOUNTER — Telehealth: Payer: Self-pay

## 2021-05-06 NOTE — Telephone Encounter (Signed)
For insurance purposes, imaging must be done at [facility]. Order/auth faxed for scheduling.

## 2021-05-25 ENCOUNTER — Ambulatory Visit: Payer: No Typology Code available for payment source

## 2021-06-22 ENCOUNTER — Inpatient Hospital Stay
Payer: No Typology Code available for payment source | Attending: Obstetrics and Gynecology | Admitting: Obstetrics and Gynecology

## 2021-06-22 ENCOUNTER — Other Ambulatory Visit: Payer: Self-pay

## 2021-06-22 VITALS — BP 117/77 | HR 68 | Temp 98.2°F | Resp 16 | Wt 178.5 lb

## 2021-06-22 DIAGNOSIS — Z8542 Personal history of malignant neoplasm of other parts of uterus: Secondary | ICD-10-CM | POA: Insufficient documentation

## 2021-06-22 DIAGNOSIS — C541 Malignant neoplasm of endometrium: Secondary | ICD-10-CM

## 2021-06-22 DIAGNOSIS — Z90722 Acquired absence of ovaries, bilateral: Secondary | ICD-10-CM | POA: Diagnosis not present

## 2021-06-22 DIAGNOSIS — Z9079 Acquired absence of other genital organ(s): Secondary | ICD-10-CM | POA: Insufficient documentation

## 2021-06-22 DIAGNOSIS — Z90710 Acquired absence of both cervix and uterus: Secondary | ICD-10-CM | POA: Diagnosis not present

## 2021-06-22 NOTE — Patient Instructions (Signed)
Follow up with Dr. Leafy Ro in 6 months ?

## 2021-06-22 NOTE — Progress Notes (Signed)
Gynecologic Oncology Interval Visit  ? ?Referring Provider: Dr. Vikki Ports Ward ? ?Chief Complaint: Endometrial Stromal Sarcoma ? ?Subjective:  ?Karla Vance is a 51 y.o. 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 She was last seen in clinic on 06/02/2020.  ? ?CT scan C/A/P  05/25/21 no evidence of recurrence. No complaints today. ? ?Gynecologic Oncology History:  ?She was seen by Dr. Leonides Schanz for surgical management of symptomatic large fibroid uterus. On 04/05/18 she underwent a laparoscopic supracervical hysterectomy with power and hand morecellation and laparoscopic bilateral salpingectomy.  Uterus was noted to be significantly enlarged and thought to be due to fibroids, but the final report did not show fibroids.  Low grade ESS was present in 17 of 27 blocks. Morcellation was performed in bag per her note.  ? ?Pathology:  ?A. UTERUS WITHOUT CERVIX, AND WITH BILATERAL FALLOPIAN TUBES; SUPRACERVICAL HYSTERECTOMY WITH BILATERAL SALPINGECTOMY:  ?- LOW-GRADE ENDOMETRIAL STROMAL SARCOMA, MEASURING AT LEAST 3.0 CM IN LARGEST SAMPLED SECTION, PRESENT IN 17 OF 27 TISSUE BLOCKS. ?- ANGIOLYMPHATIC INVASION PRESENT.  ?- MARGIN STATUS AND STAGING CANNOT BE RELIABLY EVALUATED DUE TO MORCELLATION OF SPECIMEN.  ?- BILATERAL FALLOPIAN TUBES WITH NO SIGNIFICANT HISTOPATHOLOGIC CHANGE.  ? ?B. ABNORMAL UTERINE TISSUE; CURETTAGE:  ?- LOW-GRADE ENDOMETRIAL STROMAL SARCOMA, PRESENT IN 5 OF 5 TISSUE BLOCKS.  ? ?04/24/2018- CT Abdomen Pelvis W Contrast ?1. No definite findings to suggest metastatic disease in the abdomen or pelvis. Lymph nodes in the right pelvis are upper normal for size and close attention on follow-up recommended. ?2. Small volume free fluid in the cul-de-sac, nonspecific. ? ?Patient was seen by Dr. Theora Gianotti in clinic on 04/24/2018.  Options for management were discussed at that time including plan for surgery and removal of ovaries.   Postoperative adjuvant therapy would be based on pathology.  We discussed that pathology would need to complete cytogenetic analysis to evaluate for chromosomal translocations as we correctly distinguish between low-grade ESS and high-grade ESS. ? ?On 05/01/2018 she underwent laparoscopic BSO, trachelectomy, sentinel lymph node injection, mapping, and sampling, partial omentectomy with Dr. Leonides Schanz and Dr. Fransisca Connors at [facility]. ? ?DIAGNOSIS:  ?A. OMENTUM; PARTIAL OMENTECTOMY:  ?- NEGATIVE FOR MALIGNANCY.  ?- PATCHY CHRONIC INFLAMMATION AND REACTIVE MESOTHELIAL CHANGES.  ? ?B. OVARIES, BILATERAL; OOPHORECTOMY:  ?- NEGATIVE FOR MALIGNANCY.  ?- FOLLICULAR CYST, 2.0 CM, UNILATERAL.  ?- CORTICAL INCLUSION CYSTS, BILATERAL.  ? ?C. SENTINEL LYMPH NODE, LEFT EXTERNAL ILIAC; EXCISION:  ?- NEGATIVE FOR MALIGNANCY, ONE LYMPH NODE (0/1).  ?- REACTIVE FOLLICULAR LYMPHOID HYPERPLASIA.  ? ?D. SENTINEL LYMPH NODE, RIGHT EXTERNAL ILIAC; EXCISION:  ?- NEGATIVE FOR MALIGNANCY, ONE LYMPH NODE (0/1).  ?- REACTIVE FOLLICULAR LYMPHOID HYPERPLASIA.  ? ?E. TISSUE FROM ANTERIOR BLADDER WALL; EXCISION:  ?- NEGATIVE FOR MALIGNANCY.  ?- MESOTHELIAL-LINED FIBROADIPOSE TISSUE; NO MUSCULARIS PROPRIA OR  ?UROTHELIUM IS PRESENT.  ? ?F. CERVIX; TRACHELECTOMY:  ?- NEGATIVE FOR MALIGNANCY.  ?- NECROSIS AND FOREIGN BODY REACTION DUE TO PREVIOUS HYSTERECTOMY.  ? ?DIAGNOSIS:  ?A. PELVIC WASHINGS:  ?- NEGATIVE FOR MALIGNANCY.  ?- NEUTROPHILS, CLUMPED MACROPHAGES, AND REACTIVE MESOTHELIAL CELLS.  ? ?Case was discussed at gynecologic oncology case conference on 05/01/2018.  Recommendation was to send out pathology for cytogenetic analysis at [facility].  ? ?05/08/2018-[facility] Pathology Review-  ?Diagnosis: endometrial stromal tumor, FISH, Ts- POSITIVE-result is abnormal and indicates rearrangement involving the JAZF1 gene region at 7p15 consistent with low grade endometrial stromal tumor.  ? ?Previously Dr. Fransisca Connors discussed options for  management including  watchful waiting versus adjuvant hormonal therapy with Megace. She did not opt to proceed with Megace.  ? ?She is followed by Dr. Leonides Schanz and our team at [facility]. She has been NED. ? ?06/2018 She saw Dr. Leonides Schanz in late March. A suspicious lesion at the vaginal cuff was present and was biopsied: VAGINA, CUFF, BIOPSY: UNREMARKABLE SQUAMOUS MUCOSA. NEGATIVE FOR DYSPLASIA, VIRAL EFFECT, AND MALIGNANCY.  ? ?11/11/2018- CT abdomen/pelvis ?IMPRESSION: ?1. Status post hysterectomy. No evidence of metastatic disease in the abdomen or pelvis. ?2. Previously seen prominent right obturator and iliac lymph nodes are resolved. These were likely reactive lymph nodes in the postoperative setting.  ? ?She saw Dr. Leonides Schanz on 04/15/2019 and had a negative exam.  ? ?She had a CT scan on 05/22/19   ? 1. New left external iliac lymphadenopathy, metastatic disease not excluded. Measures 1.7 cm short axis increased from 0.9 cm on 11/11/2018 CT. No other enlarged lymph nodes.  ?2. No additional potential sites of metastatic disease in the abdomen or pelvis. ?3. Aortic Atherosclerosis (ICD10-I70.0). ? ?She previously had palpable iliac lymph node which has been followed:  ?- 0.9 cm on 11/11/2018 ?- increased to 1.7 cm on 05/22/19  ?- Biopsy was benign ? ?DIAGNOSIS:  ?A. LYMPH NODE, LEFT EXTERNAL ILIAC; CT-GUIDED CORE BIOPSY:  ?- BENIGN LYMPH NODE TISSUE WITH FOLLICULAR HYPERPLASIA.  ?- NEGATIVE FOR METASTASIS.  ? ?CT 05/24/20 C/A/P at [facility] (due to morcellation at surgery) ?--No evidence of intrathoracic metastatic disease.   ?- 1.1 cm left external iliac node, previously measured 1.5 cm. Additional subcentimeter right external iliac nodes overall similar to previous exam. ?- Ill-defined lucent lesion within the right posterior L1 vertebral body is indeterminate and unchanged from previous exam. Consider evaluation with nuclear medicine bone scan versus PET /CT. ?  ?Problem List: ?Patient Active Problem List  ? Diagnosis Date Noted  ? Endometrial stromal sarcoma  ([place]) 04/24/2018  ? Fibroid uterus 04/05/2018  ? Menorrhagia 04/05/2018  ? ? ?Past Medical History: ?Past Medical History:  ?Diagnosis Date  ? Anemia   ? Arthritis   ? Cancer [facility])   ? uterine cancer, sarcoma   ? Collagen vascular disease ([place])   ? RA  ? GERD (gastroesophageal reflux disease)   ? Hypothyroidism   ? Pneumonia   ? as a child  ? Thyroid disease   ? Uterine cancer ([place])   ? Vitamin D deficiency   ? ? ?Past Surgical History: ?Past Surgical History:  ?Procedure Laterality Date  ? ABDOMINAL HYSTERECTOMY    ? LAPAROSCOPIC BILATERAL SALPINGECTOMY Bilateral 04/05/2018  ? Procedure: LAPAROSCOPIC BILATERAL SALPINGECTOMY;  Surgeon: Ward, Honor Loh, MD;  Location: [facility] ORS;  Service: Gynecology;  Laterality: Bilateral;  ? LAPAROSCOPIC SUPRACERVICAL HYSTERECTOMY N/A 04/05/2018  ? Procedure: LAPAROSCOPIC SUPRACERVICAL HYSTERECTOMY, with powe morcellation;  Surgeon: Ward, Honor Loh, MD;  Location: [facility] ORS;  Service: Gynecology;  Laterality: N/A;  ? NASAL SEPTUM SURGERY    ? OMENTECTOMY  05/01/2018  ? Procedure: OMENTECTOMY, PARTIAL;  Surgeon: Ward, Honor Loh, MD;  Location: [facility] ORS;  Service: Gynecology;;  ? OOPHORECTOMY    ? TONSILLECTOMY    ? TRACHELECTOMY N/A 05/01/2018  ? Procedure: TRACHELECTOMY, VAGINAL;  Surgeon: Ward, Honor Loh, MD;  Location: [facility] ORS;  Service: Gynecology;  Laterality: N/A;  ? ?Past Gynecologic History:  ?No abnormal Paps ?Menarche 41 ?Sexually active ? ?OB History:  ?OB History  ?Gravida Para Term Preterm AB Living  ?6 6          ?  SAB IAB Ectopic Multiple Live Births  ?           ?  ?# Outcome Date GA Lbr Len/2nd Weight Sex Delivery Anes PTL Lv  ?6 Para           ?5 Para           ?4 Para           ?3 Para           ?2 Para           ?1 Para           ?  ?Obstetric Comments  ?NSVD x 6  ? ? ?Family History: ?Family History  ?Problem Relation Age of Onset  ? Non-Hodgkin's lymphoma Mother   ? Heart disease Father   ? CAD Father 40  ?     CABG x 3 vessels  ? Breast cancer Neg Hx   ? ? ?Social  History: ?Social History  ? ?Socioeconomic History  ? Marital status: Married  ?  Spouse name: Not on file  ? Number of children: Not on file  ? Years of education: Not on file  ? Highest education level: Not on file

## 2022-06-28 ENCOUNTER — Inpatient Hospital Stay
Payer: No Typology Code available for payment source | Attending: Obstetrics and Gynecology | Admitting: Obstetrics and Gynecology

## 2022-06-28 VITALS — BP 120/83 | HR 76 | Temp 96.9°F | Resp 19 | Wt 195.6 lb

## 2022-06-28 DIAGNOSIS — Z8542 Personal history of malignant neoplasm of other parts of uterus: Secondary | ICD-10-CM | POA: Diagnosis not present

## 2022-06-28 DIAGNOSIS — Z9079 Acquired absence of other genital organ(s): Secondary | ICD-10-CM | POA: Insufficient documentation

## 2022-06-28 DIAGNOSIS — Z90710 Acquired absence of both cervix and uterus: Secondary | ICD-10-CM | POA: Diagnosis not present

## 2022-06-28 DIAGNOSIS — C541 Malignant neoplasm of endometrium: Secondary | ICD-10-CM

## 2022-06-28 NOTE — Progress Notes (Signed)
Gynecologic Oncology Interval Visit   Referring Provider: Dr. Vikki Ports Ward  Chief Complaint: Endometrial Stromal Sarcoma  Subjective:  Karla Vance is a 52 y.o. 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    No complaints today other than some loss of libido and waking up at night, but not due to hot flashes and sweating.   Gynecologic Oncology History:  She was seen by Dr. Leonides Schanz for surgical management of symptomatic large fibroid uterus. On 04/05/18 she underwent a laparoscopic supracervical hysterectomy with power and hand morecellation and laparoscopic bilateral salpingectomy.  Uterus was noted to be significantly enlarged and thought to be due to fibroids, but the final report did not show fibroids.  Low grade ESS was present in 17 of 27 blocks. Morcellation was performed in bag per her note.   Pathology:  A. UTERUS WITHOUT CERVIX, AND WITH BILATERAL FALLOPIAN TUBES; SUPRACERVICAL HYSTERECTOMY WITH BILATERAL SALPINGECTOMY:  - LOW-GRADE ENDOMETRIAL STROMAL SARCOMA, MEASURING AT LEAST 3.0 CM IN LARGEST SAMPLED SECTION, PRESENT IN 17 OF 27 TISSUE BLOCKS. - ANGIOLYMPHATIC INVASION PRESENT.  - MARGIN STATUS AND STAGING CANNOT BE RELIABLY EVALUATED DUE TO MORCELLATION OF SPECIMEN.  - BILATERAL FALLOPIAN TUBES WITH NO SIGNIFICANT HISTOPATHOLOGIC CHANGE.   B. ABNORMAL UTERINE TISSUE; CURETTAGE:  - LOW-GRADE ENDOMETRIAL STROMAL SARCOMA, PRESENT IN 5 OF 5 TISSUE BLOCKS.   04/24/2018- CT Abdomen Pelvis W Contrast 1. No definite findings to suggest metastatic disease in the abdomen or pelvis. Lymph nodes in the right pelvis are upper normal for size and close attention on follow-up recommended. 2. Small volume free fluid in the cul-de-sac, nonspecific.  Patient was seen by Dr. Theora Gianotti in clinic on 04/24/2018.  Options for management were discussed at that time including plan for surgery and removal of ovaries.   Postoperative adjuvant therapy would be based on pathology.  We discussed that pathology would need to complete cytogenetic analysis to evaluate for chromosomal translocations as we correctly distinguish between low-grade ESS and high-grade ESS.  On 05/01/2018 she underwent laparoscopic BSO, trachelectomy, sentinel lymph node injection, mapping, and sampling, partial omentectomy with Dr. Leonides Schanz and Dr. Fransisca Connors at [facility].  DIAGNOSIS:  A. OMENTUM; PARTIAL OMENTECTOMY:  - NEGATIVE FOR MALIGNANCY.  - PATCHY CHRONIC INFLAMMATION AND REACTIVE MESOTHELIAL CHANGES.   B. OVARIES, BILATERAL; OOPHORECTOMY:  - NEGATIVE FOR MALIGNANCY.  - FOLLICULAR CYST, 2.0 CM, UNILATERAL.  - CORTICAL INCLUSION CYSTS, BILATERAL.   C. SENTINEL LYMPH NODE, LEFT EXTERNAL ILIAC; EXCISION:  - NEGATIVE FOR MALIGNANCY, ONE LYMPH NODE (0/1).  - REACTIVE FOLLICULAR LYMPHOID HYPERPLASIA.   D. SENTINEL LYMPH NODE, RIGHT EXTERNAL ILIAC; EXCISION:  - NEGATIVE FOR MALIGNANCY, ONE LYMPH NODE (0/1).  - REACTIVE FOLLICULAR LYMPHOID HYPERPLASIA.   E. TISSUE FROM ANTERIOR BLADDER WALL; EXCISION:  - NEGATIVE FOR MALIGNANCY.  - MESOTHELIAL-LINED FIBROADIPOSE TISSUE; NO MUSCULARIS PROPRIA OR  UROTHELIUM IS PRESENT.   F. CERVIX; TRACHELECTOMY:  - NEGATIVE FOR MALIGNANCY.  - NECROSIS AND FOREIGN BODY REACTION DUE TO PREVIOUS HYSTERECTOMY.   DIAGNOSIS:  A. PELVIC WASHINGS:  - NEGATIVE FOR MALIGNANCY.  - NEUTROPHILS, CLUMPED MACROPHAGES, AND REACTIVE MESOTHELIAL CELLS.   Case was discussed at gynecologic oncology case conference on 05/01/2018.  Recommendation was to send out pathology for cytogenetic analysis at [facility].   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   Previously Dr.  [place] discussed options for management including  watchful waiting versus adjuvant hormonal therapy with Megace. She did not opt to proceed with Megace.   She is followed by Dr. Leonides Schanz and our team at [facility] She has been NED.  06/2018 She saw Dr. Leonides Schanz in late March. A suspicious lesion at the vaginal cuff was present and was biopsied: VAGINA, CUFF, BIOPSY: UNREMARKABLE SQUAMOUS MUCOSA. NEGATIVE FOR DYSPLASIA, VIRAL EFFECT, AND MALIGNANCY.   11/11/2018- CT abdomen/pelvis IMPRESSION: 1. Status post hysterectomy. No evidence of metastatic disease in the abdomen or pelvis. 2. Previously seen prominent right obturator and iliac lymph nodes are resolved. These were likely reactive lymph nodes in the postoperative setting.   She saw Dr. Leonides Schanz on 04/15/2019 and had a negative exam.   She had a CT scan on 05/22/19    1. New left external iliac lymphadenopathy, metastatic disease not excluded. Measures 1.7 cm short axis increased from 0.9 cm on 11/11/2018 CT. No other enlarged lymph nodes.  2. No additional potential sites of metastatic disease in the abdomen or pelvis. 3. Aortic Atherosclerosis (ICD10-I70.0).  She previously had palpable iliac lymph node which has been followed:  - 0.9 cm on 11/11/2018 - increased to 1.7 cm on 05/22/19  - Biopsy was benign  DIAGNOSIS:  A. LYMPH NODE, LEFT EXTERNAL ILIAC; CT-GUIDED CORE BIOPSY:  - BENIGN LYMPH NODE TISSUE WITH FOLLICULAR HYPERPLASIA.  - NEGATIVE FOR METASTASIS.   CT 05/24/20 [facility]/A/[facility] at [facility] (due to morcellation at surgery) --No evidence of intrathoracic metastatic disease.   - 1.1 cm left external iliac node, previously measured 1.5 cm. Additional subcentimeter right external iliac nodes overall similar to previous exam. - Ill-defined lucent lesion within the right posterior L1 vertebral body is indeterminate and unchanged from previous exam. Consider evaluation with nuclear medicine bone scan versus PET /CT.   CT scan C/A/P  05/25/21 no evidence of recurrence.   Problem List: Patient Active Problem List    Diagnosis Date Noted   Endometrial stromal sarcoma ([place]) 04/24/2018   Fibroid uterus 04/05/2018   Menorrhagia 04/05/2018    Past Medical History: Past Medical History:  Diagnosis Date   Anemia    Arthritis    Cancer ([place])    uterine cancer, sarcoma    Collagen vascular disease ([place])    RA   GERD (gastroesophageal reflux disease)    Hypothyroidism    Pneumonia    as a child   Thyroid disease    Uterine cancer ([place])    Vitamin D deficiency     Past Surgical History: Past Surgical History:  Procedure Laterality Date   ABDOMINAL HYSTERECTOMY     LAPAROSCOPIC BILATERAL SALPINGECTOMY Bilateral 04/05/2018   Procedure: LAPAROSCOPIC BILATERAL SALPINGECTOMY;  Surgeon: Ward, Honor Loh, MD;  Location: [facility] ORS;  Service: Gynecology;  Laterality: Bilateral;   LAPAROSCOPIC SUPRACERVICAL HYSTERECTOMY N/A 04/05/2018   Procedure: LAPAROSCOPIC SUPRACERVICAL HYSTERECTOMY, with powe morcellation;  Surgeon: Ward, Honor Loh, MD;  Location: [facility] ORS;  Service: Gynecology;  Laterality: N/A;   NASAL SEPTUM SURGERY     OMENTECTOMY  05/01/2018   Procedure: OMENTECTOMY, PARTIAL;  Surgeon: Ward, Honor Loh, MD;  Location: [facility] ORS;  Service: Gynecology;;   OOPHORECTOMY     TONSILLECTOMY     TRACHELECTOMY N/A 05/01/2018   Procedure: TRACHELECTOMY, VAGINAL;  Surgeon: Maceo Pro, MD;  Location: [facility] ORS;  Service: Gynecology;  Laterality: N/A;   Past Gynecologic History:  No abnormal Paps Menarche 11 Sexually active  OB History:  OB History  Gravida Para Term  Preterm AB Living  6 6          SAB IAB Ectopic Multiple Live Births               # Outcome Date GA Lbr Len/2nd Weight Sex Delivery Anes PTL Lv  6 Para           5 Para           4 Para           3 Para           2 Para           1 Para             Obstetric Comments  NSVD x 6    Family History: Family History  Problem Relation Age of Onset   Non-Hodgkin's lymphoma Mother    Heart disease Father    CAD Father 88        CABG x 3 vessels   Breast cancer Neg Hx     Social History: Social History   Socioeconomic History   Marital status: Married    Spouse name: Not on file   Number of children: Not on file   Years of education: Not on file   Highest education level: Not on file  Occupational History   Not on file  Tobacco Use   Smoking status: Never   Smokeless tobacco: Never  Vaping Use   Vaping Use: Never used  Substance and Sexual Activity   Alcohol use: Yes    Alcohol/week: 2.0 standard drinks of alcohol    Types: 2 Glasses of wine per week   Drug use: Never   Sexual activity: Yes  Other Topics Concern   Not on file  Social History Narrative   Not on file   Social Determinants of Health   Financial Resource Strain: Not on file  Food Insecurity: Not on file  Transportation Needs: Not on file  Physical Activity: Not on file  Stress: Not on file  Social Connections: Not on file  Intimate Partner Violence: Not on file   Allergies: No Known Allergies  Current Medications: Current Outpatient Medications  Medication Sig Dispense Refill   Cholecalciferol (VITAMIN D3) 25 MCG (1000 UT) CAPS Take 1,000 Units by mouth 2 (two) times daily.     folic acid (FOLVITE) 1 MG tablet Take 1 mg by mouth daily at 2 PM.      methotrexate (RHEUMATREX) 2.5 MG tablet Take 2.5 mg by mouth every Wednesday. Caution:Chemotherapy. Protect from light.     Multiple Vitamins-Minerals (MULTIVITAMIN WITH MINERALS) tablet Take 1 tablet by mouth daily.     thyroid (ARMOUR) 90 MG tablet Take 90 mg by mouth daily.      acetaminophen (TYLENOL) 500 MG tablet Take 500 mg by mouth daily as needed for moderate pain or headache.     ferrous sulfate 325 (65 FE) MG tablet Take 325 mg by mouth daily at 2 PM.  (Patient not taking: Reported on 06/02/2020)     ibuprofen (ADVIL,MOTRIN) 800 MG tablet Take 1 tablet (800 mg total) by mouth every 6 (six) hours. (Patient not taking: Reported on 06/02/2020) 45 tablet 1   ondansetron  (ZOFRAN ODT) 4 MG disintegrating tablet Take 1 tablet (4 mg total) by mouth every 8 (eight) hours as needed for nausea or vomiting. (Patient not taking: Reported on 06/22/2021) 20 tablet 0   No current facility-administered medications for this visit.   Review of Systems Review  of Systems  Constitutional:  Negative for fever, malaise/fatigue and weight loss.  HENT:  Negative for congestion and hearing loss.   Eyes:  Negative for blurred vision and double vision.  Respiratory:  Negative for cough.   Cardiovascular:  Negative for chest pain and palpitations.  Gastrointestinal:  Negative for abdominal pain, constipation, diarrhea, nausea and vomiting.  Genitourinary:  Negative for frequency and urgency.  Skin:  Negative for rash.  Neurological:  Negative for dizziness, tingling and headaches.  Endo/Heme/Allergies:  Does not bruise/bleed easily.  Psychiatric/Behavioral:  Negative for depression. The patient is not nervous/anxious and does not have insomnia.      OBJECTIVE:  Today's Vitals   06/28/22 1413  BP: 120/83  Pulse: 76  Resp: 19  Temp: (!) 96.9 F (36.1 C)  SpO2: 99%  Weight: 195 lb 9.6 oz (88.7 kg)    Body mass index is 31.57 kg/m.  GENERAL: Patient is a well appearing female in no acute distress HEENT:  Sclera clear. Anicteric NODES:  Negative axillary, supraclavicular, inguinal lymph node survery LUNGS:  Clear to auscultation bilaterally.   HEART:  Regular rate and rhythm.  ABDOMEN:  Soft, nontender.  No hernias, incisions well healed. No masses or ascites EXTREMITIES:  No peripheral edema. Atraumatic. No cyanosis SKIN:  Clear with no obvious rashes or skin changes.  NEURO:  Nonfocal. Well oriented.  Appropriate affect.  Pelvic: chaperoned by RN EGBUS: no lesions Cervix: surgically absent Vagina: no lesions, no discharge or bleeding, vaginal cuff well healed Uterus: surgically absent BME: no palpable masses Rectovaginal: normal      Assessment:  Karla Vance is a 52 y.o. 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 Clinically NED.  Normal surveillance CT scan 2/22 and 2/23. NED today on exam.   Enlarged distal iliac artery lymph node, asymptomatic with negative biopsy and not enlarging CT scan.  In view of 3 cm Low Grade ESS, risk of recurrence is relatively low at this point.    Medical co-morbidities complicating care:  Plan:   Problem List Items Addressed This Visit       Genitourinary   Endometrial stromal sarcoma ([place]) - Primary   Plan has been for CT scan every 6 months in view of morcellation of the tumor, but it has now been over 4 years since surgery. She is seeing [place] for her 6 month follow up, and will see Dr Leafy Ro 6 months from now. She will see [place] again 12 months from now. Will not do additional surveillance CT scans at this point, will only do CT if concerning symptoms or exam findings.   Discussed that HRT is contraindicated with this type of cancer.  She could take Effexor, but not really having hot insomnia due to hot flashes.    The patient's diagnosis, an outline of the further diagnostic and laboratory studies which will be required, the recommendation for surgery, and alternatives were discussed with her and her accompanying family members.  All questions were answered to their satisfaction.  I personally interviewed and examined the patient. Agreed with the above/below plan of care. I have directly contributed to assessment and plan of care of this patient and educated and discussed with patient and family.  Mellody Drown, MD

## 2022-10-24 ENCOUNTER — Other Ambulatory Visit: Payer: Self-pay | Admitting: Obstetrics and Gynecology

## 2022-10-24 DIAGNOSIS — Z1231 Encounter for screening mammogram for malignant neoplasm of breast: Secondary | ICD-10-CM

## 2022-11-21 ENCOUNTER — Ambulatory Visit
Admission: RE | Admit: 2022-11-21 | Discharge: 2022-11-21 | Disposition: A | Discharge status: Home / Self Care | Payer: No Typology Code available for payment source | Source: Ambulatory Visit | Attending: Obstetrics and Gynecology | Admitting: Obstetrics and Gynecology

## 2022-11-21 DIAGNOSIS — Z1231 Encounter for screening mammogram for malignant neoplasm of breast: Secondary | ICD-10-CM | POA: Diagnosis not present

## 2023-07-04 ENCOUNTER — Inpatient Hospital Stay
Payer: No Typology Code available for payment source | Attending: Obstetrics and Gynecology | Admitting: Obstetrics and Gynecology

## 2023-07-04 VITALS — BP 140/95 | HR 66 | Temp 97.6°F | Resp 16 | Wt 198.0 lb

## 2023-07-04 DIAGNOSIS — Z8542 Personal history of malignant neoplasm of other parts of uterus: Secondary | ICD-10-CM | POA: Diagnosis not present

## 2023-07-04 DIAGNOSIS — Z9079 Acquired absence of other genital organ(s): Secondary | ICD-10-CM | POA: Insufficient documentation

## 2023-07-04 DIAGNOSIS — Z90711 Acquired absence of uterus with remaining cervical stump: Secondary | ICD-10-CM | POA: Insufficient documentation

## 2023-07-04 DIAGNOSIS — C541 Malignant neoplasm of endometrium: Secondary | ICD-10-CM

## 2023-07-04 NOTE — Progress Notes (Signed)
 Gynecologic Oncology Interval Visit   Referring Provider: Dr. Leeroy Bock Ward  Chief Complaint: Endometrial Stromal Sarcoma  Subjective:  Karla Vance is a 53 y.o. 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    No complaints today.    Gynecologic Oncology History:  She was seen by Dr. Elesa Massed for surgical management of symptomatic large fibroid uterus. On 04/05/18 she underwent a laparoscopic supracervical hysterectomy with power and hand morecellation and laparoscopic bilateral salpingectomy.  Uterus was noted to be significantly enlarged and thought to be due to fibroids, but the final report did not show fibroids.  Low grade ESS was present in 17 of 27 blocks. Morcellation was performed in bag per her note.   Pathology:  A. UTERUS WITHOUT CERVIX, AND WITH BILATERAL FALLOPIAN TUBES; SUPRACERVICAL HYSTERECTOMY WITH BILATERAL SALPINGECTOMY:  - LOW-GRADE ENDOMETRIAL STROMAL SARCOMA, MEASURING AT LEAST 3.0 CM IN LARGEST SAMPLED SECTION, PRESENT IN 17 OF 27 TISSUE BLOCKS. - ANGIOLYMPHATIC INVASION PRESENT.  - MARGIN STATUS AND STAGING CANNOT BE RELIABLY EVALUATED DUE TO MORCELLATION OF SPECIMEN.  - BILATERAL FALLOPIAN TUBES WITH NO SIGNIFICANT HISTOPATHOLOGIC CHANGE.   B. ABNORMAL UTERINE TISSUE; CURETTAGE:  - LOW-GRADE ENDOMETRIAL STROMAL SARCOMA, PRESENT IN 5 OF 5 TISSUE BLOCKS.   04/24/2018- CT Abdomen Pelvis W Contrast 1. No definite findings to suggest metastatic disease in the abdomen or pelvis. Lymph nodes in the right pelvis are upper normal for size and close attention on follow-up recommended. 2. Small volume free fluid in the cul-de-sac, nonspecific.  Patient was seen by Dr. Sonia Side in clinic on 04/24/2018.  Options for management were discussed at that time including plan for surgery and removal of ovaries.  Postoperative adjuvant therapy would be based on pathology.  We discussed that pathology would need  to complete cytogenetic analysis to evaluate for chromosomal translocations as we correctly distinguish between low-grade ESS and high-grade ESS.  On 05/01/2018 she underwent laparoscopic BSO, trachelectomy, sentinel lymph node injection, mapping, and sampling, partial omentectomy with Dr. Elesa Massed and Dr. Johnnette Litter at [facility].  DIAGNOSIS:  A. OMENTUM; PARTIAL OMENTECTOMY:  - NEGATIVE FOR MALIGNANCY.  - PATCHY CHRONIC INFLAMMATION AND REACTIVE MESOTHELIAL CHANGES.   B. OVARIES, BILATERAL; OOPHORECTOMY:  - NEGATIVE FOR MALIGNANCY.  - FOLLICULAR CYST, 2.0 CM, UNILATERAL.  - CORTICAL INCLUSION CYSTS, BILATERAL.   C. SENTINEL LYMPH NODE, LEFT EXTERNAL ILIAC; EXCISION:  - NEGATIVE FOR MALIGNANCY, ONE LYMPH NODE (0/1).  - REACTIVE FOLLICULAR LYMPHOID HYPERPLASIA.   D. SENTINEL LYMPH NODE, RIGHT EXTERNAL ILIAC; EXCISION:  - NEGATIVE FOR MALIGNANCY, ONE LYMPH NODE (0/1).  - REACTIVE FOLLICULAR LYMPHOID HYPERPLASIA.   E. TISSUE FROM ANTERIOR BLADDER WALL; EXCISION:  - NEGATIVE FOR MALIGNANCY.  - MESOTHELIAL-LINED FIBROADIPOSE TISSUE; NO MUSCULARIS PROPRIA OR  UROTHELIUM IS PRESENT.   F. CERVIX; TRACHELECTOMY:  - NEGATIVE FOR MALIGNANCY.  - NECROSIS AND FOREIGN BODY REACTION DUE TO PREVIOUS HYSTERECTOMY.   DIAGNOSIS:  A. PELVIC WASHINGS:  - NEGATIVE FOR MALIGNANCY.  - NEUTROPHILS, CLUMPED MACROPHAGES, AND REACTIVE MESOTHELIAL CELLS.   Case was discussed at gynecologic oncology case conference on 05/01/2018.  Recommendation was to send out pathology for cytogenetic analysis at [facility].   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   Previously Dr. Johnnette Litter discussed options for management including watchful waiting versus adjuvant hormonal therapy with Megace. She did not opt  to proceed with Megace.    She is followed by Dr. Elesa Massed and our team at [facility]. She has been NED.  06/2018 She saw Dr. Elesa Massed in late March. A suspicious lesion at the vaginal cuff was present and was biopsied: VAGINA, CUFF, BIOPSY: UNREMARKABLE SQUAMOUS MUCOSA. NEGATIVE FOR DYSPLASIA, VIRAL EFFECT, AND MALIGNANCY.   11/11/2018- CT abdomen/pelvis IMPRESSION: 1. Status post hysterectomy. No evidence of metastatic disease in the abdomen or pelvis. 2. Previously seen prominent right obturator and iliac lymph nodes are resolved. These were likely reactive lymph nodes in the postoperative setting.   She saw Dr. Elesa Massed on 04/15/2019 and had a negative exam.   She had a CT scan on 05/22/19    1. New left external iliac lymphadenopathy, metastatic disease not excluded. Measures 1.7 cm short axis increased from 0.9 cm on 11/11/2018 CT. No other enlarged lymph nodes.  2. No additional potential sites of metastatic disease in the abdomen or pelvis. 3. Aortic Atherosclerosis (ICD10-I70.0).  She previously had palpable iliac lymph node which has been followed:  - 0.9 cm on 11/11/2018 - increased to 1.7 cm on 05/22/19  - Biopsy was benign  DIAGNOSIS:  A. LYMPH NODE, LEFT EXTERNAL ILIAC; CT-GUIDED CORE BIOPSY:  - BENIGN LYMPH NODE TISSUE WITH FOLLICULAR HYPERPLASIA.  - NEGATIVE FOR METASTASIS.   CT 05/24/20 C/A/P at [facility] (due to morcellation at surgery) --No evidence of intrathoracic metastatic disease.   - 1.1 cm left external iliac node, previously measured 1.5 cm. Additional subcentimeter right external iliac nodes overall similar to previous exam. - Ill-defined lucent lesion within the right posterior L1 vertebral body is indeterminate and unchanged from previous exam. Consider evaluation with nuclear medicine bone scan versus PET /CT.   CT scan C/A/P  05/25/21 no evidence of recurrence.   Problem List: Patient Active Problem List   Diagnosis Date Noted   Endometrial stromal sarcoma (HCC) 04/24/2018   Fibroid uterus 04/05/2018    Menorrhagia 04/05/2018    Past Medical History: Past Medical History:  Diagnosis Date   Anemia    Arthritis    Cancer (HCC)    uterine cancer, sarcoma    Collagen vascular disease (HCC)    RA   GERD (gastroesophageal reflux disease)    Hypothyroidism    Pneumonia    as a child   Thyroid disease    Uterine cancer (HCC)    Vitamin D deficiency     Past Surgical History: Past Surgical History:  Procedure Laterality Date   ABDOMINAL HYSTERECTOMY     LAPAROSCOPIC BILATERAL SALPINGECTOMY Bilateral 04/05/2018   Procedure: LAPAROSCOPIC BILATERAL SALPINGECTOMY;  Surgeon: Ward, Elenora Fender, MD;  Location: [facility] ORS;  Service: Gynecology;  Laterality: Bilateral;   LAPAROSCOPIC SUPRACERVICAL HYSTERECTOMY N/A 04/05/2018   Procedure: LAPAROSCOPIC SUPRACERVICAL HYSTERECTOMY, with powe morcellation;  Surgeon: Ward, Elenora Fender, MD;  Location: [facility] ORS;  Service: Gynecology;  Laterality: N/A;   NASAL SEPTUM SURGERY     OMENTECTOMY  05/01/2018   Procedure: OMENTECTOMY, PARTIAL;  Surgeon: Ward, Elenora Fender, MD;  Location: [facility] ORS;  Service: Gynecology;;   OOPHORECTOMY     TONSILLECTOMY     TRACHELECTOMY N/A 05/01/2018   Procedure: TRACHELECTOMY, VAGINAL;  Surgeon: Leola Brazil, MD;  Location: [facility] ORS;  Service: Gynecology;  Laterality: N/A;   Past Gynecologic History:  No abnormal Paps Menarche 11 Sexually active  OB History:  OB History  Gravida Para Term Preterm AB Living  6 6      SAB IAB Ectopic Multiple Live Births          #  Outcome Date GA Lbr Len/2nd Weight Sex Type Anes PTL Lv  6 Para           5 Para           4 Para           3 Para           2 Para           1 Para             Obstetric Comments  NSVD x 6    Family History: Family History  Problem Relation Age of Onset   Non-Hodgkin's lymphoma Mother    Heart disease Father    CAD Father 57       CABG x 3 vessels   Breast cancer Neg Hx     Social History: Social History   Socioeconomic History   Marital  status: Married    Spouse name: Not on file   Number of children: Not on file   Years of education: Not on file   Highest education level: Not on file  Occupational History   Not on file  Tobacco Use   Smoking status: Never   Smokeless tobacco: Never  Vaping Use   Vaping status: Never Used  Substance and Sexual Activity   Alcohol use: Yes    Alcohol/week: 2.0 standard drinks of alcohol    Types: 2 Glasses of wine per week   Drug use: Never   Sexual activity: Yes  Other Topics Concern   Not on file  Social History Narrative   Not on file   Social Drivers of Health   Financial Resource Strain: Not on file  Food Insecurity: Not on file  Transportation Needs: Not on file  Physical Activity: Not on file  Stress: Not on file  Social Connections: Not on file  Intimate Partner Violence: Not on file   Allergies: No Known Allergies  Current Medications: Current Outpatient Medications  Medication Sig Dispense Refill   acetaminophen (TYLENOL) 500 MG tablet Take 500 mg by mouth daily as needed for moderate pain or headache.     Cholecalciferol (VITAMIN D3) 25 MCG (1000 UT) CAPS Take 1,000 Units by mouth 2 (two) times daily.     ferrous sulfate 325 (65 FE) MG tablet Take 325 mg by mouth daily at 2 PM.  (Patient not taking: Reported on 06/02/2020)     folic acid (FOLVITE) 1 MG tablet Take 1 mg by mouth daily at 2 PM.      ibuprofen (ADVIL,MOTRIN) 800 MG tablet Take 1 tablet (800 mg total) by mouth every 6 (six) hours. (Patient not taking: Reported on 06/02/2020) 45 tablet 1   methotrexate (RHEUMATREX) 2.5 MG tablet Take 2.5 mg by mouth every Wednesday. Caution:Chemotherapy. Protect from light.     Multiple Vitamins-Minerals (MULTIVITAMIN WITH MINERALS) tablet Take 1 tablet by mouth daily.     ondansetron (ZOFRAN ODT) 4 MG disintegrating tablet Take 1 tablet (4 mg total) by mouth every 8 (eight) hours as needed for nausea or vomiting. (Patient not taking: Reported on 07/04/2023) 20 tablet  0   thyroid (ARMOUR) 90 MG tablet Take 90 mg by mouth daily.      No current facility-administered medications for this visit.   Review of Systems Review of Systems  Constitutional:  Negative for fever, malaise/fatigue and weight loss.  HENT:  Negative for congestion and hearing loss.   Eyes:  Negative for blurred vision and double vision.  Respiratory:  Negative  for cough.   Cardiovascular:  Negative for chest pain and palpitations.  Gastrointestinal:  Negative for abdominal pain, constipation, diarrhea, nausea and vomiting.  Genitourinary:  Negative for frequency and urgency.  Skin:  Negative for rash.  Neurological:  Negative for dizziness, tingling and headaches.  Endo/Heme/Allergies:  Does not bruise/bleed easily.  Psychiatric/Behavioral:  Negative for depression. The patient is not nervous/anxious and does not have insomnia.    OBJECTIVE:  Today's Vitals   07/04/23 1430 07/04/23 1434  BP: (!) 148/92 (!) 140/95  Pulse: 66   Resp: 16   Temp: 97.6 F (36.4 C)   TempSrc: Tympanic   Weight: 198 lb (89.8 kg)   PainSc:  0-No pain    Body mass index is 31.96 kg/m.  GENERAL: Patient is a well appearing female in no acute distress HEENT:  Sclera clear. Anicteric NODES:  Negative axillary, supraclavicular, inguinal lymph node survery LUNGS:  Clear to auscultation bilaterally.   HEART:  Regular rate and rhythm.  ABDOMEN:  Soft, nontender.  No hernias, incisions well healed. No masses or ascites EXTREMITIES:  No peripheral edema. Atraumatic. No cyanosis SKIN:  Clear with no obvious rashes or skin changes.  NEURO:  Nonfocal. Well oriented.  Appropriate affect.  Pelvic: chaperoned by RN EGBUS: no lesions Cervix: surgically absent Vagina: no lesions, no discharge or bleeding, vaginal cuff well healed Uterus: surgically absent BME: no palpable masses Rectovaginal: normal   Assessment:  Karla Vance is a 53 y.o. 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 Clinically NED.  Normal surveillance CT scan 2/22 and 2/23. NED today on exam.   Enlarged distal iliac artery lymph node, asymptomatic with negative biopsy and not enlarging CT scan.  In view of 3 cm Low Grade ESS, risk of recurrence is relatively low at this point.    Medical co-morbidities complicating care:  Plan:   Problem List Items Addressed This Visit       Genitourinary   Endometrial stromal sarcoma (HCC) - Primary    She had CT scans every 6 months in view of morcellation of the tumor, but it has now been over 5 years since surgery. She will see [place] again 12 months from now. Will not do additional surveillance CT scans at this point, will only do CT if concerning symptoms or exam findings.   Discussed that HRT is contraindicated with this type of cancer.  She could take Effexor, but not really having hot insomnia due to hot flashes.    The patient's diagnosis, an outline of the further diagnostic and laboratory studies which will be required, the recommendation for surgery, and alternatives were discussed with her and her accompanying family members.  All questions were answered to their satisfaction.  I personally interviewed and examined the patient. Agreed with the above/below plan of care. I have directly contributed to assessment and plan of care of this patient and educated and discussed with patient and family.  Leida Lauth, MD

## 2023-09-19 ENCOUNTER — Other Ambulatory Visit: Payer: Self-pay | Admitting: Nurse Practitioner

## 2023-09-19 DIAGNOSIS — Z1231 Encounter for screening mammogram for malignant neoplasm of breast: Secondary | ICD-10-CM

## 2024-06-11 ENCOUNTER — Ambulatory Visit

## 2024-07-02 ENCOUNTER — Ambulatory Visit
